# Patient Record
Sex: Female | Born: 1990 | Race: White | Hispanic: No | Marital: Married | Smoking: Former smoker
Health system: Southern US, Community
[De-identification: ages and names within clinical notes are randomized; demographics above are authoritative.]

## PROBLEM LIST (undated history)

## (undated) ENCOUNTER — Inpatient Hospital Stay (HOSPITAL_COMMUNITY): Payer: Self-pay

## (undated) DIAGNOSIS — F909 Attention-deficit hyperactivity disorder, unspecified type: Secondary | ICD-10-CM

## (undated) DIAGNOSIS — Z8489 Family history of other specified conditions: Secondary | ICD-10-CM

## (undated) DIAGNOSIS — K589 Irritable bowel syndrome without diarrhea: Secondary | ICD-10-CM

## (undated) DIAGNOSIS — S99921A Unspecified injury of right foot, initial encounter: Secondary | ICD-10-CM

## (undated) HISTORY — PX: KNEE ARTHROSCOPY: SHX127

---
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
## 2009-01-22 ENCOUNTER — Telehealth: Payer: Self-pay | Admitting: Family Medicine

## 2009-02-16 ENCOUNTER — Ambulatory Visit: Payer: Self-pay | Admitting: Family Medicine

## 2009-02-16 DIAGNOSIS — N63 Unspecified lump in unspecified breast: Secondary | ICD-10-CM | POA: Insufficient documentation

## 2009-05-04 ENCOUNTER — Ambulatory Visit: Payer: Self-pay | Admitting: Family Medicine

## 2009-05-04 ENCOUNTER — Other Ambulatory Visit: Admission: RE | Admit: 2009-05-04 | Discharge: 2009-05-04 | Payer: Self-pay | Admitting: Family Medicine

## 2009-05-04 DIAGNOSIS — N760 Acute vaginitis: Secondary | ICD-10-CM | POA: Insufficient documentation

## 2009-05-07 LAB — CONVERTED CEMR LAB: Pap Smear: NEGATIVE

## 2009-06-01 ENCOUNTER — Telehealth: Payer: Self-pay | Admitting: Family Medicine

## 2009-06-29 ENCOUNTER — Ambulatory Visit: Payer: Self-pay | Admitting: Family Medicine

## 2009-06-29 DIAGNOSIS — L255 Unspecified contact dermatitis due to plants, except food: Secondary | ICD-10-CM

## 2009-07-01 ENCOUNTER — Emergency Department (HOSPITAL_COMMUNITY): Admission: EM | Admit: 2009-07-01 | Discharge: 2009-07-01 | Payer: Self-pay | Admitting: Emergency Medicine

## 2009-07-01 ENCOUNTER — Telehealth: Payer: Self-pay | Admitting: Family Medicine

## 2009-07-03 ENCOUNTER — Telehealth: Payer: Self-pay | Admitting: Family Medicine

## 2009-07-03 ENCOUNTER — Encounter: Payer: Self-pay | Admitting: Family Medicine

## 2009-07-14 ENCOUNTER — Telehealth: Payer: Self-pay | Admitting: Family Medicine

## 2009-08-10 ENCOUNTER — Ambulatory Visit: Payer: Self-pay | Admitting: Family Medicine

## 2009-08-10 DIAGNOSIS — J029 Acute pharyngitis, unspecified: Secondary | ICD-10-CM | POA: Insufficient documentation

## 2009-08-20 ENCOUNTER — Telehealth: Payer: Self-pay | Admitting: Family Medicine

## 2009-11-02 ENCOUNTER — Other Ambulatory Visit: Admission: RE | Admit: 2009-11-02 | Discharge: 2009-11-02 | Payer: Self-pay | Admitting: Family Medicine

## 2009-11-02 ENCOUNTER — Ambulatory Visit: Payer: Self-pay | Admitting: Family Medicine

## 2009-11-02 LAB — CONVERTED CEMR LAB
AST: 21 units/L (ref 0–37)
Albumin: 4.2 g/dL (ref 3.5–5.2)
Alkaline Phosphatase: 65 units/L (ref 39–117)
Basophils Absolute: 0 10*3/uL (ref 0.0–0.1)
Bilirubin, Direct: 0.1 mg/dL (ref 0.0–0.3)
CO2: 26 meq/L (ref 19–32)
Calcium: 9.7 mg/dL (ref 8.4–10.5)
Creatinine, Ser: 0.9 mg/dL (ref 0.4–1.2)
Eosinophils Absolute: 0.1 10*3/uL (ref 0.0–0.7)
GFR calc non Af Amer: 83.26 mL/min (ref >60)
Glucose, Bld: 97 mg/dL (ref 70–99)
Hemoglobin: 13.8 g/dL (ref 12.0–15.0)
Lymphocytes Relative: 26.3 % (ref 12.0–46.0)
MCHC: 34.1 g/dL (ref 30.0–36.0)
Monocytes Relative: 5.9 % (ref 3.0–12.0)
Neutro Abs: 4.8 10*3/uL (ref 1.4–7.7)
Neutrophils Relative %: 66.5 % (ref 43.0–77.0)
Platelets: 233 10*3/uL (ref 150.0–400.0)
RDW: 12.5 % (ref 11.5–14.6)
Sodium: 144 meq/L (ref 135–145)
Total Bilirubin: 0.9 mg/dL (ref 0.3–1.2)

## 2009-11-06 ENCOUNTER — Telehealth: Payer: Self-pay | Admitting: Family Medicine

## 2009-11-10 ENCOUNTER — Ambulatory Visit: Payer: Self-pay | Admitting: Family Medicine

## 2009-11-11 DIAGNOSIS — S060XAA Concussion with loss of consciousness status unknown, initial encounter: Secondary | ICD-10-CM | POA: Insufficient documentation

## 2009-11-11 DIAGNOSIS — S060X9A Concussion with loss of consciousness of unspecified duration, initial encounter: Secondary | ICD-10-CM

## 2009-12-31 ENCOUNTER — Ambulatory Visit: Payer: Self-pay | Admitting: Family Medicine

## 2010-01-18 IMAGING — CT CT ABDOMEN W/O CM
2 of 4 series · 17 of 46 positions shown, 19 images · non-contrast
Comparison: None.

CT ABDOMEN

CLINICAL DATA: Low back pain and hematuria for 3 days.  Bladder
infection.  Question ureteral calculus.

CT ABDOMEN AND PELVIS WITHOUT CONTRAST
TECHNIQUE: Multidetector CT imaging of the abdomen and pelvis was
performed following the standard
protocol without intravenous contrast.

[Series 2: abd_pel_w/o 5.0 b30f st · axial · 0.68mm/px · z∈[-427,-57]mm · 14 of 82 slices shown, 16 images]
[im 4/82  soft-tissue]
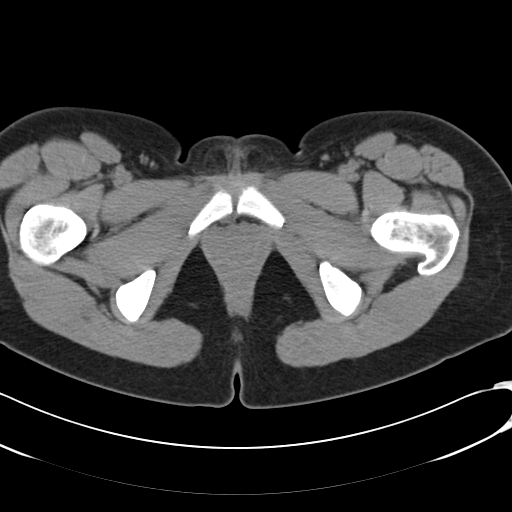
[im 4/82  bone]
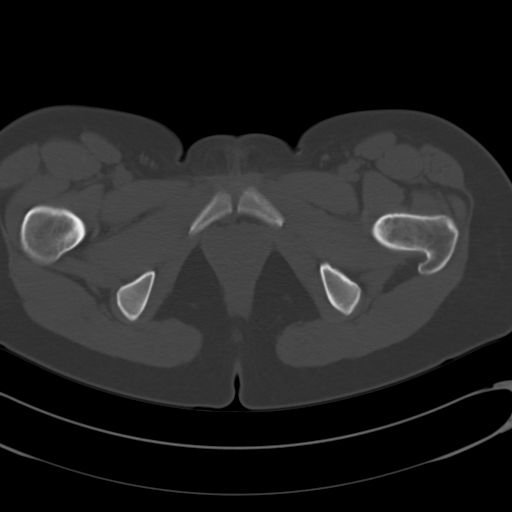
[im 10/82  soft-tissue]
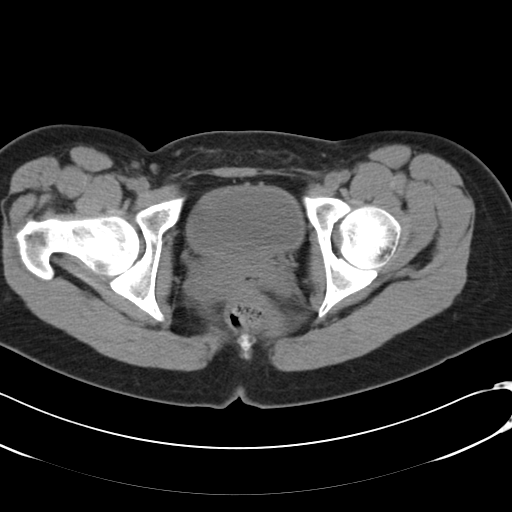
[im 16/82  soft-tissue]
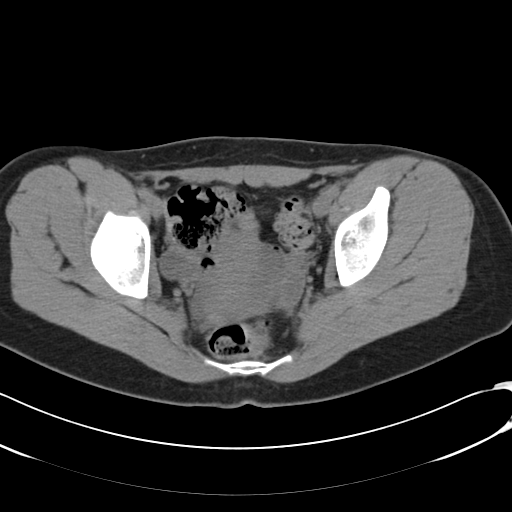
[im 22/82  soft-tissue]
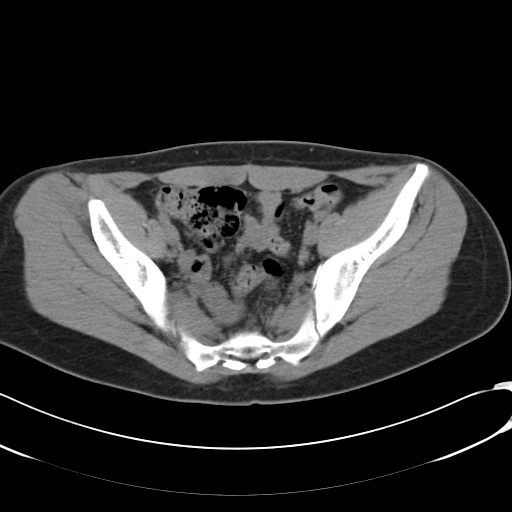
[im 29/82  soft-tissue]
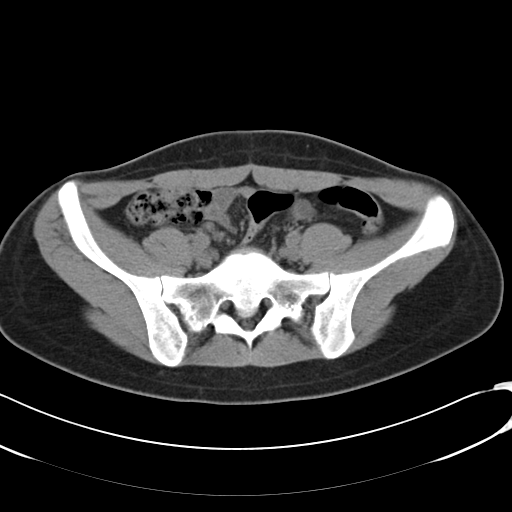
[im 32/82  soft-tissue]
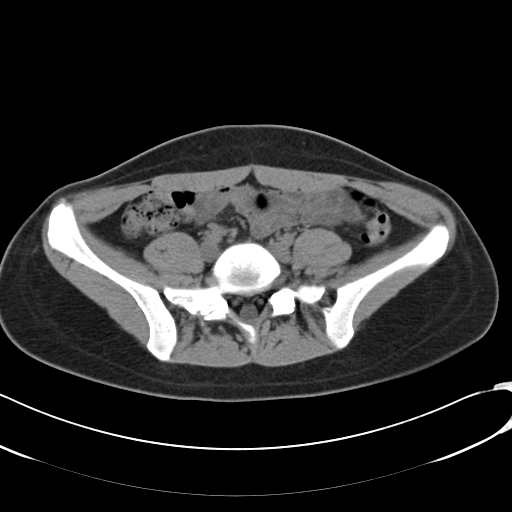
[im 38/82  soft-tissue]
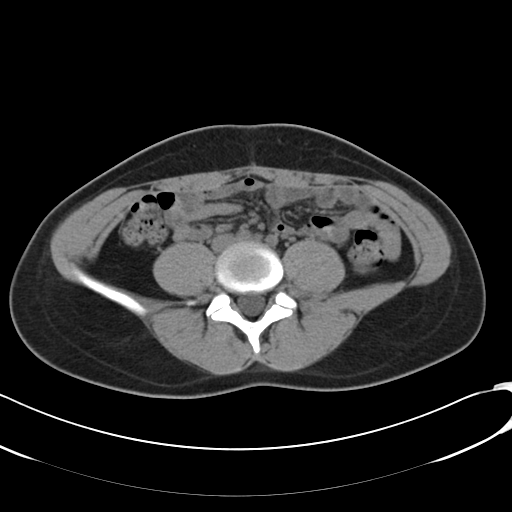
[im 44/82  soft-tissue]
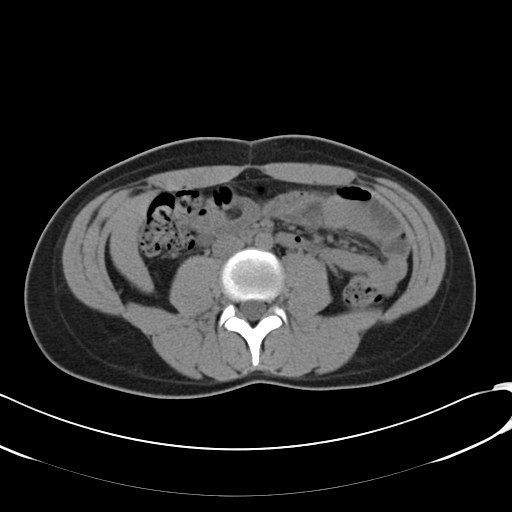
[im 50/82  soft-tissue]
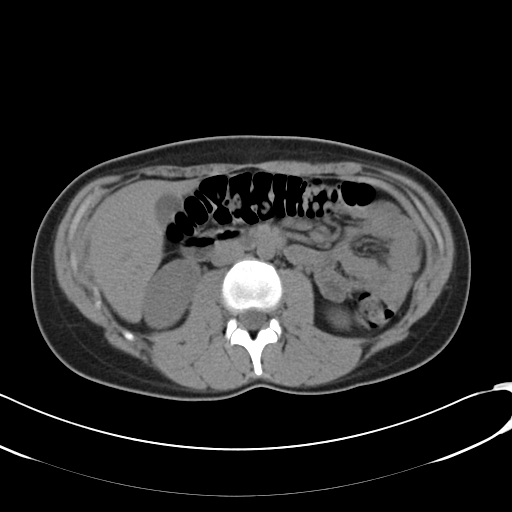
[im 50/82  bone]
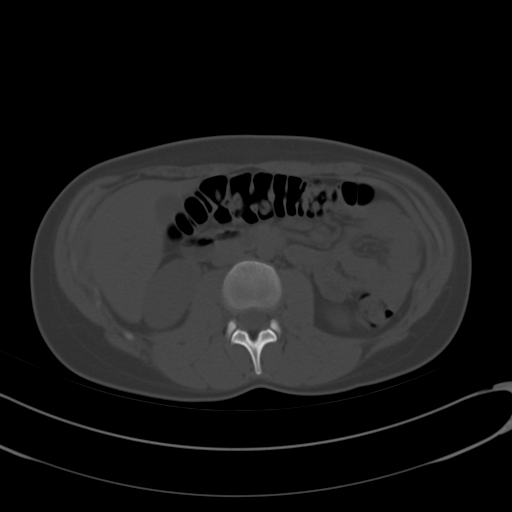
[im 53/82  soft-tissue]
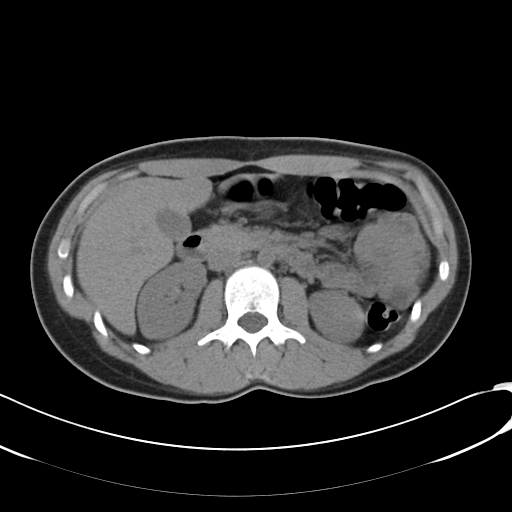
[im 60/82  soft-tissue]
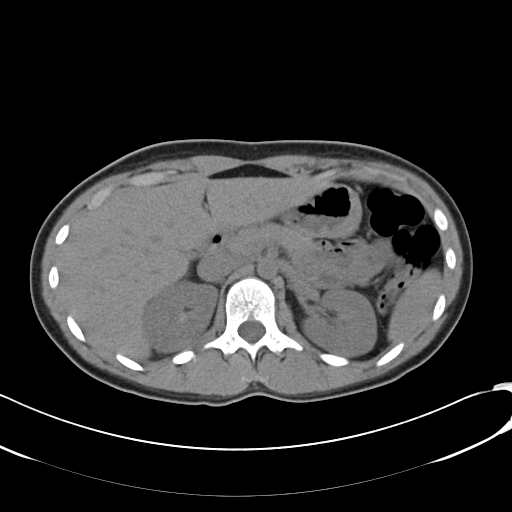
[im 66/82  soft-tissue]
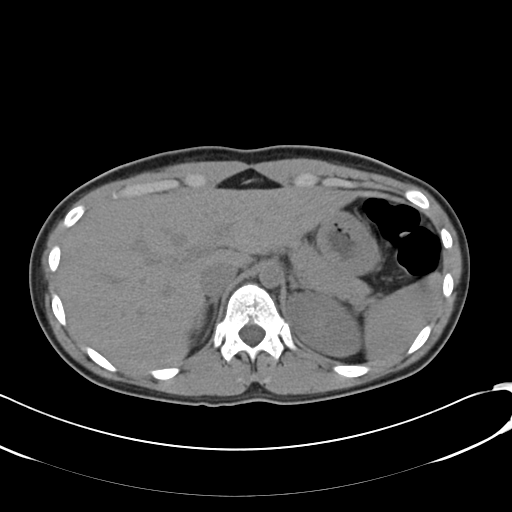
[im 72/82  soft-tissue]
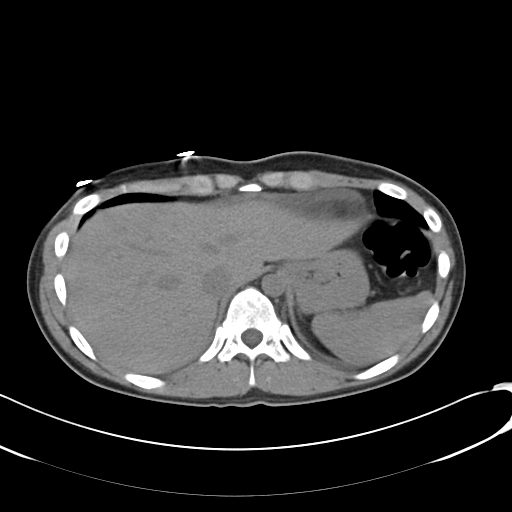
[im 78/82  soft-tissue]
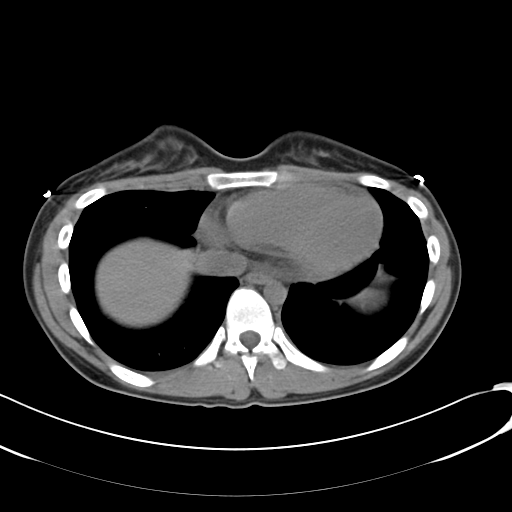

[Series 602: <mpr range> · coronal · 0.81mm/px · 3 of 84 slices shown]
[im 28/84  soft-tissue]
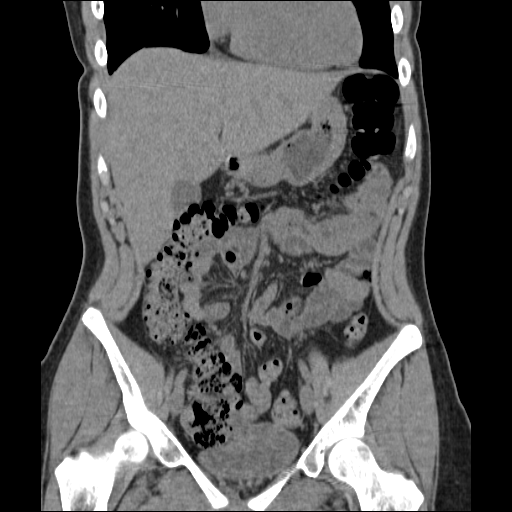
[im 37/84  soft-tissue]
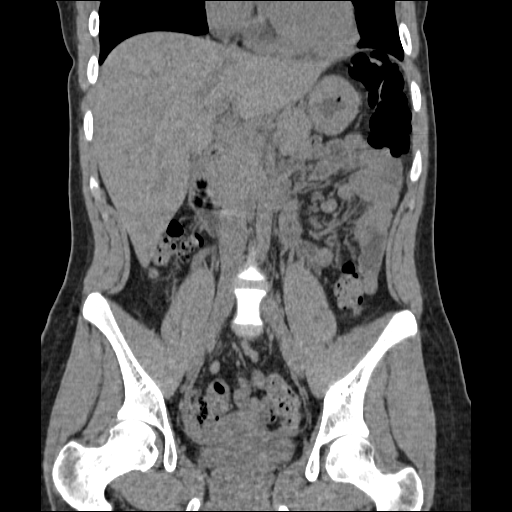
[im 47/84  soft-tissue]
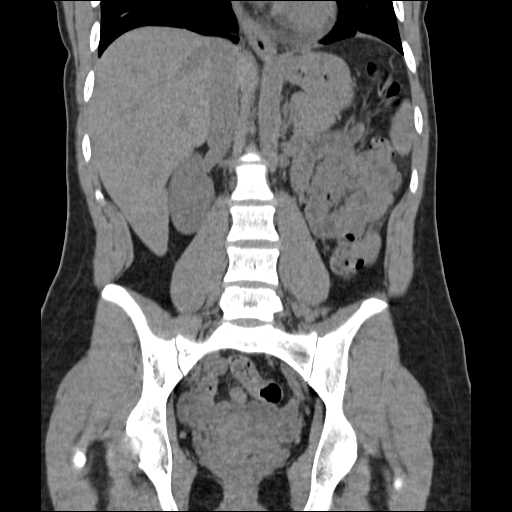

[17 of 46 positions shown; findings below may reference images not displayed]

FINDINGS: No renal or ureteral calculi are demonstrated.  There is
no hydronephrosis or perinephric soft tissue stranding.  Both
kidneys appear unremarkable as imaged in the unenhanced state.

The lung bases are clear.  There is no pleural effusion.  The
liver, spleen, gallbladder, pancreas and adrenal glands appear
unremarkable.  The lumbar spine demonstrates no abnormality.
IMPRESSION: 1.  Negative for urinary tract calculus or hydronephrosis.
2.  No nephric or perinephric inflammatory changes are identified.

CT PELVIS
FINDINGS: Distally, the ureters are normal in caliber.  There is
no evidence of ureteral or bladder calculus.  No pelvic
inflammatory process, fluid collection or mass is demonstrated.
IMPRESSION: 1.  Negative for urinary tract calculus or hydronephrosis.
2.  No pelvic inflammatory changes are identified.

## 2010-02-16 NOTE — Assessment & Plan Note (Signed)
Summary: st/njr Samantha Medina will give depo shot/njr   Vital Signs:  Patient profile:   20 year old female Menstrual status:  regular Height:      63 inches (160.02 cm) Weight:      128.31 pounds (58.32 kg) O2 Sat:      98 % on Room air Temp:     98.6 degrees F (37.00 degrees C) oral Pulse rate:   63 / minute BP sitting:   120 / 68  (left arm) Cuff size:   regular  Vitals Entered By: Josph Macho RMA (August 10, 2009 4:06 PM)  O2 Flow:  Room air CC: Sore throat X1 day- pt states she gets Strep Throat frequently/ Depo shot/ CF Is Patient Diabetic? No   History of Present Illness: Patient in today for her Depo shot and has just started to have a sore throat over the past day. She has a history of recurrent strep throat in the past. She has a severe throat  that is more painful when she eats or swallows. She denies any nasal congesiton/f/c/HA yet. No nasal congestion/cough/CP/SOB/GI c/o.   Current Medications (verified): 1)  Adderall Xr 30 Mg Xr24h-Cap (Amphetamine-Dextroamphetamine) .... Take 1 Tablet By Mouth Every Morning 2)  Adderall Xr 30 Mg Xr24h-Cap (Amphetamine-Dextroamphetamine) .... Take One Tab in The Evening  Fill in One Month 3)  Adderall Xr 30 Mg Xr24h-Cap (Amphetamine-Dextroamphetamine) .... Take One Tab in The Evening   Fill in Two Months  Allergies (verified): No Known Drug Allergies  Past History:  Past medical history reviewed for relevance to current acute and chronic problems. Social history (including risk factors) reviewed for relevance to current acute and chronic problems.  Past Medical History: Reviewed history from 07/09/2007 and no changes required. Depression HPV abn pap  Social History: Reviewed history from 01/22/2008 and no changes required. Negative history of passive tobacco smoke exposure.  Single Drug use-no Occupation: Alcohol use-no  Review of Systems      See HPI  Physical Exam  General:  Well-developed,well-nourished,in no acute  distress; alert,appropriate and cooperative throughout examination Head:  Normocephalic and atraumatic without obvious abnormalities Eyes:  No corneal or conjunctival inflammation noted. EOMI. Marland Kitchen Mouth:  Oral mucosa without lesions. Pus pockets noted on b/l tonsils. Both tonsils erythematous and 1/4. Neck:  No deformities, masses, or tenderness noted. Lungs:  Normal respiratory effort, chest expands symmetrically. Lungs are clear to auscultation, no crackles or wheezes. Heart:  Normal rate and regular rhythm. S1 and S2 normal without gallop, murmur, click, rub or other extra sounds. Abdomen:  Bowel sounds positive,abdomen soft and non-tender without masses, organomegaly or hernias noted. Extremities:  No clubbing, cyanosis, edema, or deformity noted with normal full range of motion of all joints.   Cervical Nodes:  R anterior LN enlarged and L anterior LN enlarged.   Psych:  Cognition and judgment appear intact. Alert and cooperative with normal attention span and concentration. No apparent delusions, illusions, hallucinations   Impression & Recommendations:  Problem # 1:  PHARYNGITIS, ACUTE (ICD-462)  The following medications were removed from the medication list:    Metronidazole 250 Mg Tabs (Metronidazole) .Marland Kitchen... Take one tab by mouth three times a day Her updated medication list for this problem includes:    Zithromax 250 Mg Tabs (Azithromycin) .Marland Kitchen... 2 tabs by mouth once and then 1 tab by mouth once daily x 4 days Push clear fluids, Ibuprofen as needed, report if symptoms do not resolve Encouraged Align probiotic daily for next month to decrease  likelihood of recurrent BV or yeast  Problem # 2:  CONTRACEPTIVE MANAGEMENT (ICD-V25.09)  Orders: Depo-Provera 150mg  (J1055) Admin of Therapeutic Inj  intramuscular or subcutaneous ([address]) tolerated shot today  Complete Medication List: 1)  Adderall Xr 30 Mg Xr24h-cap (Amphetamine-dextroamphetamine) .... Take 1 tablet by mouth every  morning 2)  Adderall Xr 30 Mg Xr24h-cap (Amphetamine-dextroamphetamine) .... Take one tab in the evening  fill in one month 3)  Adderall Xr 30 Mg Xr24h-cap (Amphetamine-dextroamphetamine) .... Take one tab in the evening   fill in two months 4)  Zithromax 250 Mg Tabs (Azithromycin) .... 2 tabs by mouth once and then 1 tab by mouth once daily x 4 days  Patient Instructions: 1)  Please schedule a follow-up appointment as needed if symptoms worsen 2)  Take your antibiotic as prescribed until ALL of it is gone, but stop if you develop a rash or swelling and contact our office as soon as possible.  3)  Take Align probiotic caps daily x 1 month whenever you take an antibiotic Prescriptions: ZITHROMAX 250 MG TABS (AZITHROMYCIN) 2 tabs by mouth once and then 1 tab by mouth once daily x 4 days  #6 x 0   Entered and Authorized by:   Danise Edge MD   Signed by:   Danise Edge MD on 08/10/2009   Method used:   Electronically to        [facility] [address] [phone]* (retail)       [address]       [place], [place]  [address]       Ph: [phone] or [phone]       Fax: [phone]   RxID:   [id number]    Medication Administration  Injection # 1:    Medication: Depo-Provera 150mg     Diagnosis: CONTRACEPTIVE MANAGEMENT (ICD-V25.09)    Route: IM    Site: RUOQ gluteus    Exp Date: 03/17/2012    Lot #: [id number]    Mfr: [email or web address]    Comments: patient to return October 2011    Patient tolerated injection without complications    Given by: Kern Reap CMA Duncan Dull) (August 10, 2009 4:57 PM)  Orders Added: 1)  Depo-Provera 150mg  [J1055] 2)  Admin of Therapeutic Inj  intramuscular or subcutaneous [96372] 3)  Est. Patient Level III [95638]

## 2010-02-16 NOTE — Assessment & Plan Note (Signed)
Summary: recent fall/dm   Vital Signs:  Patient profile:   20 year old female Menstrual status:  irregular Weight:      124 pounds Temp:     98.4 degrees F oral BP sitting:   120 / 84  (left arm) Cuff size:   regular  Vitals Entered By: Kern Reap CMA Duncan Dull) (November 10, 2009 4:13 PM) CC: fell and hit head   CC:  fell and hit head.  History of Present Illness: Samantha Medina is a 20 year old single female, who comes in today for evaluation of a fall.  She states about two weeks ago she tripped and fell in her bedroom at home and hit the back of her head.  She was not knocked out.  She is initially a small hematoma that resolved.  Neurologic review of systems negative  Allergies: No Known Drug Allergies  Review of Systems      See HPI  Physical Exam  General:  Well-developed,well-nourished,in no acute distress; alert,appropriate and cooperative throughout examination Neurologic:  No cranial nerve deficits noted. Station and gait are normal. Plantar reflexes are down-going bilaterally. DTRs are symmetrical throughout. Sensory, motor and coordinative functions appear intact.   Impression & Recommendations:  Problem # 1:  CONCUSSION (ICD-850.9) Assessment New  Complete Medication List: 1)  Adderall Xr 30 Mg Xr24h-cap (Amphetamine-dextroamphetamine) .... Take 1 tablet by mouth every morning 2)  Adderall Xr 30 Mg Xr24h-cap (Amphetamine-dextroamphetamine) .... Take one tab in the evening  fill in one month 3)  Adderall Xr 30 Mg Xr24h-cap (Amphetamine-dextroamphetamine) .... Take one tab in the evening   fill in two months 4)  Zithromax 250 Mg Tabs (Azithromycin) .... 2 tabs by mouth once and then 1 tab by mouth once daily x 4 days 5)  Nuvaring 0.12-0.015 Mg/24hr Ring (Etonogestrel-ethinyl estradiol) .... Uad 6)  Metronidazole 500 Mg Tabs (Metronidazole) .Marland Kitchen.. 1 tab @ bedtime  Patient Instructions: 1)  be reassured that over the next two to  4 weeks.  The headache and etc., will  resolve.  Return p.r.n.   Orders Added: 1)  Est. Patient Level IV [29562]

## 2010-02-16 NOTE — Assessment & Plan Note (Signed)
Summary: stds/poison ivy/dm   Vital Signs:  Patient profile:   20 year old female Menstrual status:  regular Weight:      127 pounds Temp:     97.9 degrees F oral BP sitting:   110 / 70  (left arm) Cuff size:   regular  Vitals Entered By: Kern Reap CMA Duncan Dull) (June 29, 2009 9:26 AM) CC: poison ivy, STD    CC:  poison ivy and STD .  History of Present Illness: Samantha Medina is a 20 year old single female, who comes in today to discuss 3 problems.  She would like to be on some type of birth control.  We discussed the various options.  She elects to try the Depo-Provera.  Will give her her first shot when her next period starts.  She is also concerned she may have an STD although she is asymptomatic.  She continues to take her at all and is working well.  Another new problem as a contact dermatitis.  Now she's broken out all over.  Allergies (verified): No Known Drug Allergies  Past History:  Past medical, surgical, family and social histories (including risk factors) reviewed for relevance to current acute and chronic problems.  Past Medical History: Reviewed history from 07/09/2007 and no changes required. Depression HPV abn pap  Family History: Reviewed history from 09/14/2006 and no changes required. Family History of Hypertension Fam hx Asthma  Social History: Reviewed history from 01/22/2008 and no changes required. Negative history of passive tobacco smoke exposure.  Single Drug use-no Occupation: Alcohol use-no  Review of Systems      See HPI  Physical Exam  General:  Well-developed,well-nourished,in no acute distress; alert,appropriate and cooperative throughout examination Abdomen:  Bowel sounds positive,abdomen soft and non-tender without masses, organomegaly or hernias noted. Genitalia:  Pelvic Exam:        External: normal female genitalia without lesions or masses        Vagina: normal without lesions or masses        Cervix: normal without  lesions or masses        Adnexa: normal bimanual exam without masses or fullness        Uterus: normal by palpation        Pap smear: not performed Skin:  Intact without suspicious lesions or rashes   Impression & Recommendations:  Problem # 1:  CONTACT DERMATITIS&OTHER ECZEMA DUE TO PLANTS (ICD-692.6) Assessment New  Her updated medication list for this problem includes:    Prednisone 20 Mg Tabs (Prednisone) ..... Uad  Problem # 2:  BACTERIAL VAGINITIS (ICD-616.10) Assessment: Deteriorated  The following medications were removed from the medication list:    Flagyl 250 Mg Tabs (Metronidazole) .Marland Kitchen... Take 1 tablet by mouth two times a day  Complete Medication List: 1)  Adderall Xr 30 Mg Xr24h-cap (Amphetamine-dextroamphetamine) .... Take 1 tablet by mouth every morning 2)  Adderall Xr 30 Mg Xr24h-cap (Amphetamine-dextroamphetamine) .... Take one tab in the evening  fill in one month 3)  Adderall Xr 30 Mg Xr24h-cap (Amphetamine-dextroamphetamine) .... Take one tab in the evening   fill in two months 4)  Methylphenidate Hcl 10 Mg Tabs (Methylphenidate hcl) .Marland Kitchen.. 1 @ 2pm 5)  Prednisone 20 Mg Tabs (Prednisone) .... Uad  Other Orders: T-Chlamydia Probe, genital [phone]) T-GC Probe, genital [phone])  Patient Instructions: 1)  begin prednisone, take two tabs x 3 days, one x 3 days, a half x 3 days, then half a tablet Monday, Wednesday, Friday, for a two week taper.  2)  Return in October for a follow-up Pap smear. 3)  Return in June, the 27th Monday to see Fleet Contras to get y  first step of shot. Prescriptions: PREDNISONE 20 MG TABS (PREDNISONE) UAD  #30 x 1   Entered and Authorized by:   Roderick Pee MD   Signed by:   Roderick Pee MD on 06/29/2009   Method used:   Electronically to        [facility] [address] #[id number]* (retail)       [address] [place], [place]  [address]       Ph: [phone]       Fax: [phone]   RxID:   [id number]

## 2010-02-16 NOTE — Progress Notes (Signed)
Summary: needs note today  Phone Note Call from Patient Call back at Home Phone [phone]   Caller: Patient--live call Summary of Call: was here this week with a rash. went to hosp and they told her that it was hives. she is requesting a dictated note stating that her rash was not contagious to send to her job. please call her when ready for pick up today. Initial call taken by: Warnell Forester,  July 03, 2009 1:54 PM  Follow-up for Phone Call        ok Follow-up by: Roderick Pee MD,  July 03, 2009 1:58 PM  Additional Follow-up for Phone Call Additional follow up Details #1::        left message on machine for patient  Additional Follow-up by: Kern Reap CMA Duncan Dull),  July 03, 2009 2:05 PM

## 2010-02-16 NOTE — Progress Notes (Signed)
Summary: new rx  Phone Note Call from Patient Call back at Home Phone [phone]   Caller: Patient Call For: Samantha Pee MD Summary of Call: pt needs adderall xr 30 mg pt has one pill left. Initial call taken by: Heron Sabins,  January 22, 2009 12:27 PM  Follow-up for Phone Call        rx ready for pick up and cpx scheduled Follow-up by: Kern Reap CMA Duncan Dull),  January 22, 2009 2:29 PM    Prescriptions: ADDERALL XR 30 MG XR24H-CAP (AMPHETAMINE-DEXTROAMPHETAMINE) take one tab in the evening   fill in two months  #30 x 0   Entered by:   Kern Reap CMA (AAMA)   Authorized by:   Samantha Pee MD   Signed by:   Kern Reap CMA (AAMA) on 01/22/2009   Method used:   Print then Give to Patient   RxID:   [id number] ADDERALL XR 30 MG XR24H-CAP (AMPHETAMINE-DEXTROAMPHETAMINE) take one tab in the evening  fill in one month  #30 x 0   Entered by:   Kern Reap CMA (AAMA)   Authorized by:   Samantha Pee MD   Signed by:   Kern Reap CMA (AAMA) on 01/22/2009   Method used:   Print then Give to Patient   RxID:   [id number] ADDERALL XR 30 MG XR24H-CAP (AMPHETAMINE-DEXTROAMPHETAMINE) Take 1 tablet by mouth every morning  #30 x 0   Entered by:   Kern Reap CMA (AAMA)   Authorized by:   Samantha Pee MD   Signed by:   Kern Reap CMA (AAMA) on 01/22/2009   Method used:   Print then Give to Patient   RxID:   [id number]

## 2010-02-16 NOTE — Progress Notes (Signed)
Summary: D/C Depo Provera  Phone Note Call from Patient   Caller: Patient Call For: Roderick Pee MD Summary of Call: Pt wants to stop Depo due to increase appetite, and rash on chest. [phone]  Follow-up for Phone Call        ok.............Marland Kitchenwhat alternative type of birth control, would  she like  to use, or which she liked to come in so we can discuss it?????????????? Follow-up by: Roderick Pee MD,  August 20, 2009 9:40 AM  Additional Follow-up for Phone Call Additional follow up Details #1::        patient does not want the pill.  she is would like to continue the depo if the rash will go away. Additional Follow-up by: Kern Reap CMA Duncan Dull),  August 20, 2009 5:28 PM    Additional Follow-up for Phone Call Additional follow up Details #2::    make an appointment on Monday to let me look at the rash Follow-up by: Roderick Pee MD,  August 21, 2009 1:38 PM  Additional Follow-up for Phone Call Additional follow up Details #3:: Details for Additional Follow-up Action Taken: it's not a rash it is acne, weight gain and she is wondering if this is a side affect. Additional Follow-up by: Kern Reap CMA Duncan Dull),  August 21, 2009 2:54 PM

## 2010-02-16 NOTE — Assessment & Plan Note (Signed)
Summary: cpx/ pt will come fasting/mm   Vital Signs:  Patient profile:   20 year old female Menstrual status:  regular LMP:     02/16/2008 Height:      63 inches Weight:      126 pounds BMI:     22.40 Temp:     98.1 degrees F oral BP sitting:   120 / 80  (left arm) Cuff size:   regular  Vitals Entered By: Kern Reap CMA Duncan Dull) (February 16, 2009 11:26 AM)  Reason for Visit cpx  History of Present Illness: Samantha Medina is a 20 year old single female in gauge to to be married in the fall of 2011 and comes in today for physical examination,  She's always been in excellent, health.  She's had no chronic health problems.  She has underlying ADD for which he takes Adderall 30 mg long-acting daily.  She is doing well at [facility] focus and concentration are excellent.  She feels much better taking the medication.  LMP now.  Birth control.  She stopped her BCPs.  She is not concerned if she gets pregnant and engaged to be married.  Tetanus booster and flu shot today.    Allergies: No Known Drug Allergies  Past History:  Past medical, surgical, family and social histories (including risk factors) reviewed, and no changes noted (except as noted below).  Past Medical History: Reviewed history from 07/09/2007 and no changes required. Depression HPV abn pap  Family History: Reviewed history from 09/14/2006 and no changes required. Family History of Hypertension Fam hx Asthma  Social History: Reviewed history from 01/22/2008 and no changes required. Negative history of passive tobacco smoke exposure.  Single Drug use-no Occupation: Alcohol use-no  Review of Systems      See HPI       Flu Vaccine Consent Questions     Do you have a history of severe allergic reactions to this vaccine? no    Any prior history of allergic reactions to egg and/or gelatin? no    Do you have a sensitivity to the preservative Thimersol? no    Do you have a past history of Guillan-Barre Syndrome?  no    Do you currently have an acute febrile illness? no    Have you ever had a severe reaction to latex? no    Vaccine information given and explained to patient? yes    Are you currently pregnant? no    Lot Number:[id number]   Exp Date:07/16/2009   Site Given  right  Deltoid IM   Physical Exam  General:  Well-developed,well-nourished,in no acute distress; alert,appropriate and cooperative throughout examination Head:  Normocephalic and atraumatic without obvious abnormalities. No apparent alopecia or balding. Eyes:  No corneal or conjunctival inflammation noted. EOMI. Perrla. Funduscopic exam benign, without hemorrhages, exudates or papilledema. Vision grossly normal. Ears:  External ear exam shows no significant lesions or deformities.  Otoscopic examination reveals clear canals, tympanic membranes are intact bilaterally without bulging, retraction, inflammation or discharge. Hearing is grossly normal bilaterally. Nose:  External nasal examination shows no deformity or inflammation. Nasal mucosa are pink and moist without lesions or exudates. Mouth:  Oral mucosa and oropharynx without lesions or exudates.  Teeth in good repair. Neck:  No deformities, masses, or tenderness noted. Chest Wall:  No deformities, masses, or tenderness noted. Breasts:  the right breast is normal.  The left breast at the 4 o'clock position 1 inch from the nipple is a marble like a cystic lesion.  Its soft  is rubbery and movable.this is a new finding.  The patient was taught how to do breast self examination Lungs:  Normal respiratory effort, chest expands symmetrically. Lungs are clear to auscultation, no crackles or wheezes. Heart:  Normal rate and regular rhythm. S1 and S2 normal without gallop, murmur, click, rub or other extra sounds. Abdomen:  Bowel sounds positive,abdomen soft and non-tender without masses, organomegaly or hernias noted. Msk:  No deformity or scoliosis noted of thoracic or lumbar spine.    Pulses:  R and L carotid,radial,femoral,dorsalis pedis and posterior tibial pulses are full and equal bilaterally Extremities:  No clubbing, cyanosis, edema, or deformity noted with normal full range of motion of all joints.   Neurologic:  No cranial nerve deficits noted. Station and gait are normal. Plantar reflexes are down-going bilaterally. DTRs are symmetrical throughout. Sensory, motor and coordinative functions appear intact. Skin:  Intact without suspicious lesions or rashes Cervical Nodes:  No lymphadenopathy noted Axillary Nodes:  No palpable lymphadenopathy Inguinal Nodes:  No significant adenopathy Psych:  Cognition and judgment appear intact. Alert and cooperative with normal attention span and concentration. 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 Take 1 tablet by mouth every morning 2)  Adderall Xr 30 Mg Xr24h-cap (Amphetamine-dextroamphetamine) .... Take one tab in the evening  fill in one month 3)  Adderall Xr 30 Mg Xr24h-cap (Amphetamine-dextroamphetamine) .... Take one tab in the evening   fill in two months  Other Orders: Admin 1st Vaccine (95621) Flu Vaccine 75yrs + (30865) Tdap => 85yrs IM (78469) Admin of Any Addtl Vaccine (62952)  Patient Instructions: 1)  continue your current medications.  Return in two weeks for your Pap smear. 2)  Do a thorough breast exam monthly.  If the cystic lesion in your left breast increases in size.  Let us recheck that   Immunizations Administered:  Tetanus Vaccine:    Vaccine Type: Tdap    Site: left deltoid    Mfr: GlaxoSmithKline    Dose: 0.5 ml    Route:  IM    Given by: Kern Reap CMA (AAMA)    Exp. Date: 11/12/2009    Lot #: [id number]    Physician counseled: yes

## 2010-02-16 NOTE — Progress Notes (Signed)
  Phone Note Outgoing Call   Summary of Call: her Pap smear in April was clear, however, this shows some slight changes, which is otherwise, okay.  All STD test negative.  Currently taking Flagyl, 500 b.i.d. for BP follow-up as outlined Initial call taken by: Roderick Pee MD,  November 06, 2009 12:47 PM

## 2010-02-16 NOTE — Progress Notes (Signed)
Summary: short breath  Phone Note Call from Patient   Summary of Call: Short of breath this am.  Now getting dizzy.  Mentions that she was told that shot for poison ivy given if short of breath when she saw Dr. Tawanna Cooler earlier this week.  To ER now via 911 if necessary. Initial call taken by: Rudy Jew, RN,  July 01, 2009 1:09 PM

## 2010-02-16 NOTE — Assessment & Plan Note (Signed)
Summary: ? YEAST INFECTION//CCM   Vital Signs:  Patient profile:   20 year old female Menstrual status:  regular Weight:      133 pounds Temp:     98.0 degrees F BP sitting:   120 / 80  (left arm) Cuff size:   regular  Vitals Entered By: Kern Reap CMA Duncan Dull) (May 04, 2009 3:36 PM) CC: vaginal discharge   CC:  vaginal discharge.  History of Present Illness: Samantha Medina is a 20 year old single female, nonsmoker, who comes in today for general physical examination  She still is in a neck, house she's had no chronic health problems except for ADD.  She is currently on Adderall 30 mg daily, however, she is having breakthrough symptoms in the afternoon.  We discussed various strategies.  Will give her a short acting tablet to take p.r.n..  Her period ended April, the eighth.  For birth control she and her boyfriend use condoms.  Four days ago she began having a malodorous vaginal discharge.  No itching.  She also has a history of fibrocystic breasts.  Lesion 9 o'clock left breast at the nipple area, unchanged.  She does BSE monthly  Allergies: No Known Drug Allergies  Past History:  Past medical, surgical, family and social histories (including risk factors) reviewed, and no changes noted (except as noted below).  Past Medical History: Reviewed history from 07/09/2007 and no changes required. Depression HPV abn pap  Family History: Reviewed history from 09/14/2006 and no changes required. Family History of Hypertension Fam hx Asthma  Social History: Reviewed history from 01/22/2008 and no changes required. Negative history of passive tobacco smoke exposure.  Single Drug use-no Occupation: Alcohol use-no  Review of Systems      See HPI  Physical Exam  General:  Well-developed,well-nourished,in no acute distress; alert,appropriate and cooperative throughout examination Head:  Normocephalic and atraumatic without obvious abnormalities. No apparent alopecia or  balding. Eyes:  No corneal or conjunctival inflammation noted. EOMI. Perrla. Funduscopic exam benign, without hemorrhages, exudates or papilledema. Vision grossly normal. Ears:  External ear exam shows no significant lesions or deformities.  Otoscopic examination reveals clear canals, tympanic membranes are intact bilaterally without bulging, retraction, inflammation or discharge. Hearing is grossly normal bilaterally. Nose:  External nasal examination shows no deformity or inflammation. Nasal mucosa are pink and moist without lesions or exudates. Mouth:  Oral mucosa and oropharynx without lesions or exudates.  Teeth in good repair. Neck:  No deformities, masses, or tenderness noted. Chest Wall:  No deformities, masses, or tenderness noted. Breasts:  right breast normal left breast.  A soft pea-sized lesion at the 9 o'clock position next to the nipple.  Its been present previously.  No change Lungs:  Normal respiratory effort, chest expands symmetrically. Lungs are clear to auscultation, no crackles or wheezes. Heart:  Normal rate and regular rhythm. S1 and S2 normal without gallop, murmur, click, rub or other extra sounds. Abdomen:  Bowel sounds positive,abdomen soft and non-tender without masses, organomegaly or hernias noted. Genitalia:  Pelvic Exam:        External: normal female genitalia without lesions or masses        Vagina: normal without lesions or masses        Cervix: normal without lesions or masses        Adnexa: normal bimanual exam without masses or fullness        Uterus: normal by palpation        Pap smear: performed Msk:  No deformity  or scoliosis noted of thoracic or lumbar spine.   Pulses:  R and L carotid,radial,femoral,dorsalis pedis and posterior tibial pulses are full and equal bilaterally Extremities:  No clubbing, cyanosis, edema, or deformity noted with normal full range of motion of all joints.   Neurologic:  No cranial nerve deficits noted. Station and gait are  normal. Plantar reflexes are down-going bilaterally. DTRs are symmetrical throughout. Sensory, motor and coordinative functions appear intact. Skin:  total body skin exam normal Cervical Nodes:  No lymphadenopathy noted Axillary Nodes:  No palpable lymphadenopathy Inguinal Nodes:  No significant adenopathy Psych:  Cognition and judgment appear intact. Alert and cooperative with normal attention span and concentration. No apparent delusions, illusions, hallucinations   Impression & Recommendations:  Problem # 1:  BREAST MASS, LEFT (ICD-611.72) Assessment Unchanged  Problem # 2:  ATTENTION DEFICIT HYPERACTIVITY DISORDER, ADULT (ICD-314.01) Assessment: Improved  Problem # 3:  PHYSICAL EXAMINATION (ICD-V70.0) Assessment: Unchanged  Problem # 4:  BACTERIAL VAGINITIS (ICD-616.10) Assessment: New  Her updated medication list for this problem includes:    Flagyl 250 Mg Tabs (Metronidazole) .Marland Kitchen... Take 1 tablet by mouth two times a day  Complete Medication List: 1)  Adderall Xr 30 Mg Xr24h-cap (Amphetamine-dextroamphetamine) .... Take 1 tablet by mouth every morning 2)  Adderall Xr 30 Mg Xr24h-cap (Amphetamine-dextroamphetamine) .... Take one tab in the evening  fill in one month 3)  Adderall Xr 30 Mg Xr24h-cap (Amphetamine-dextroamphetamine) .... Take one tab in the evening   fill in two months 4)  Methylphenidate Hcl 10 Mg Tabs (Methylphenidate hcl) .Marland Kitchen.. 1 @ 2pm 5)  Flagyl 250 Mg Tabs (Metronidazole) .... Take 1 tablet by mouth two times a day  Patient Instructions: 1)  we will call you only get your culture report back.  Take it.  Flagyl, 500 mg twice a day for one week. 2)  Remember to do BSE monthly  3)  At 10 mg of plain Ritalin in the afternoon if he needed to help with focusing concentration.  If this doesn't work, let us know. 4)  Please schedule a follow-up appointment in 1 year. Prescriptions: FLAGYL 250 MG TABS (METRONIDAZOLE) Take 1 tablet by mouth two times a day  #14 x 1    Entered and Authorized by:   Roderick Pee MD   Signed by:   Roderick Pee MD on 05/04/2009   Method used:   Electronically to        [facility] [address] [phone]* (retail)       [address]       [place], [place]  [address]       Ph: [phone] or [phone]       Fax: [phone]   RxID:   [id number] METHYLPHENIDATE HCL 10 MG TABS (METHYLPHENIDATE HCL) 1 @ 2pm  #30 x 0   Entered and Authorized by:   Roderick Pee MD   Signed by:   Roderick Pee MD on 05/04/2009   Method used:   Print then Give to Patient   RxID:   [id number] ADDERALL XR 30 MG XR24H-CAP (AMPHETAMINE-DEXTROAMPHETAMINE) take one tab in the evening   fill in two months  #30 x 0   Entered by:   Kern Reap CMA (AAMA)   Authorized by:   Roderick Pee MD   Signed by:   Kern Reap CMA (AAMA) on 05/04/2009   Method used:   Print then Give to Patient  RxID:   [id number] ADDERALL XR 30 MG XR24H-CAP (AMPHETAMINE-DEXTROAMPHETAMINE) take one tab in the evening  fill in one month  #30 x 0   Entered by:   Kern Reap CMA (AAMA)   Authorized by:   Roderick Pee MD   Signed by:   Kern Reap CMA (AAMA) on 05/04/2009   Method used:   Print then Give to Patient   RxID:   [id number] ADDERALL XR 30 MG XR24H-CAP (AMPHETAMINE-DEXTROAMPHETAMINE) Take 1 tablet by mouth every morning  #30 x 0   Entered by:   Kern Reap CMA (AAMA)   Authorized by:   Roderick Pee MD   Signed by:   Kern Reap CMA (AAMA) on 05/04/2009   Method used:   Print then Give to Patient   RxID:   [id number]

## 2010-02-16 NOTE — Progress Notes (Signed)
Summary: vaginal infection  Phone Note Call from Patient   Caller: Patient Call For: Roderick Pee MD Summary of Call: Pt has vaginal discharge (white) with no pain or itching, but foul odor.  [facility] [place] [facility] [place]) [phone] Pt was better after taking the Flagyl, but  symptoms returned. Initial call taken by: Lynann Beaver CMA,  Jun 01, 2009 9:41 AM  Follow-up for Phone Call        second round of Flagyl 250 mg b.i.d. x 10 days, refills x 1 Follow-up by: Roderick Pee MD,  Jun 01, 2009 10:22 AM    Prescriptions: FLAGYL 250 MG TABS (METRONIDAZOLE) Take 1 tablet by mouth two times a day  #20 x 1   Entered by:   Lynann Beaver CMA   Authorized by:   Roderick Pee MD   Signed by:   Lynann Beaver CMA on 06/01/2009   Method used:   Electronically to        [facility] [address] [phone]* (retail)       [address]       [place], [place]  [address]       Ph: [phone] or [phone]       Fax: [phone]   RxID:   [id number]  Pt notified.

## 2010-02-16 NOTE — Assessment & Plan Note (Signed)
Summary: REPEAT PAP/ALSO HIV TEST/ALSO DEPO INJ/NJR   Vital Signs:  Patient profile:   20 year old female Menstrual status:  irregular LMP:     08/10/2009 Weight:      124 pounds Temp:     98.2 degrees F oral BP sitting:   110 / 74  (left arm) Cuff size:   regular  Vitals Entered By: Kern Reap CMA Duncan Dull) (November 02, 2009 9:31 AM) CC: pap LMP (date): 08/10/2009     Menstrual Status irregular Enter LMP: 08/10/2009 Last PAP Result NEGATIVE FOR INTRAEPITHELIAL LESIONS OR MALIGNANCY.   CC:  pap.  History of Present Illness: Samantha Medina  is a 20 year old single female, nonsmoker, who comes in today for general physical examination and to discuss ADD and a different method of birth control.  Also, she would like some treatment for acne.  She takes Adderall 30 mg long-acting daily for ADD......... doing well in school.  Also working part-time at the [employer].  she has  occasional acne.  She would like some topical medication.  We saw her while back with a malodorous vaginal discharge.  We felt was Bv...Marland KitchenMarland KitchenMarland Kitchen she was given Flagyl, and the discharge and odor when away.  However, now is back to  She has been using the Depo every 90 days for birth control she would like to try another option.  She does not like the pills.  She would like to try the nuvaring   Last 06-06-22.  Her grandfather died he done dialysis for 32 years.  Recommended Judithe Modest for grief counseling  She is also complaining of dyspareunia  Allergies: No Known Drug Allergies  Past History:  Past medical, surgical, family and social histories (including risk factors) reviewed, and no changes noted (except as noted below).  Past Medical History: Reviewed history from 07/09/2007 and no changes required. Depression HPV abn pap PMH-FH-SH reviewed-no changes except otherwise noted  Family History: Reviewed history from 09/14/2006 and no changes required. Family History of Hypertension Fam hx  Asthma  Social History: Reviewed history from 01/22/2008 and no changes required. Negative history of passive tobacco smoke exposure.  Single Drug use-no Occupation: Alcohol use-no  Review of Systems      See HPI       Flu Vaccine Consent Questions     Do you have a history of severe allergic reactions to this vaccine? no    Any prior history of allergic reactions to egg and/or gelatin? no    Do you have a sensitivity to the preservative Thimersol? no    Do you have a past history of Guillan-Barre Syndrome? no    Do you currently have an acute febrile illness? no    Have you ever had a severe reaction to latex? no    Vaccine information given and explained to patient? yes    Are you currently pregnant? no    Lot Number:[id number]   Exp Date:07/17/2010   Site Given  Right  Deltoid IM   Physical Exam  General:  Well-developed,well-nourished,in no acute distress; alert,appropriate and cooperative throughout examination Head:  Normocephalic and atraumatic without obvious abnormalities. No apparent alopecia or balding. Eyes:  No corneal or conjunctival inflammation noted. EOMI. Perrla. Funduscopic exam benign, without hemorrhages, exudates or papilledema. Vision grossly normal. Ears:  External ear exam shows no significant lesions or deformities.  Otoscopic examination reveals clear canals, tympanic membranes are intact bilaterally without bulging, retraction, inflammation or discharge. Hearing is grossly normal bilaterally. Nose:  External nasal examination shows  no deformity or inflammation. Nasal mucosa are pink and moist without lesions or exudates. Mouth:  Oral mucosa and oropharynx without lesions or exudates.  Teeth in good repair. Neck:  No deformities, masses, or tenderness noted. Chest Wall:  No deformities, masses, or tenderness noted. Breasts:  No mass, nodules, thickening, tenderness, bulging, retraction, inflamation, nipple discharge or skin changes noted.   Lungs:   Normal respiratory effort, chest expands symmetrically. Lungs are clear to auscultation, no crackles or wheezes. Heart:  Normal rate and regular rhythm. S1 and S2 normal without gallop, murmur, click, rub or other extra sounds. Abdomen:  Bowel sounds positive,abdomen soft and non-tender without masses, organomegaly or hernias noted. Genitalia:  externa genitalia normal limits.  Vaginal vault was normal.  Cervix appears red and inflamed.  Bimanual exam negative Msk:  No deformity or scoliosis noted of thoracic or lumbar spine.   Pulses:  R and L carotid,radial,femoral,dorsalis pedis and posterior tibial pulses are full and equal bilaterally Extremities:  No clubbing, cyanosis, edema, or deformity noted with normal full range of motion of all joints.   Neurologic:  No cranial nerve deficits noted. Station and gait are normal. Plantar reflexes are down-going bilaterally. DTRs are symmetrical throughout. Sensory, motor and coordinative functions appear intact. Skin:  Intact without suspicious lesions or rashes Cervical Nodes:  No lymphadenopathy noted Axillary Nodes:  No palpable lymphadenopathy Inguinal Nodes:  No significant adenopathy Psych:  Cognition and judgment appear intact. Alert and cooperative with normal attention span and concentration. No apparent delusions, illusions, hallucinations   Impression & Recommendations:  Problem # 1:  CONTRACEPTIVE MANAGEMENT (ICD-V25.09) Assessment Deteriorated  Orders: Venipuncture (36644) TLB-BMP (Basic Metabolic Panel-BMET) (80048-METABOL) TLB-CBC Platelet - w/Differential (85025-CBCD) TLB-Hepatic/Liver Function Pnl (80076-HEPATIC) TLB-TSH (Thyroid Stimulating Hormone) (84443-TSH) T-HIV Antibody  (Reflex) ([id number]) Prescription Created Electronically [phone]) Specimen Handling (43329)  Problem # 2:  BACTERIAL VAGINITIS (ICD-616.10) Assessment: Deteriorated  Her updated medication list for this problem includes:    Zithromax 250 Mg Tabs  (Azithromycin) .Marland Kitchen... 2 tabs by mouth once and then 1 tab by mouth once daily x 4 days    Metronidazole 500 Mg Tabs (Metronidazole) .Marland Kitchen... 1 tab @ bedtime  Orders: Venipuncture (51884) TLB-BMP (Basic Metabolic Panel-BMET) (80048-METABOL) TLB-CBC Platelet - w/Differential (85025-CBCD) TLB-Hepatic/Liver Function Pnl (80076-HEPATIC) TLB-TSH (Thyroid Stimulating Hormone) (84443-TSH) T-HIV Antibody  (Reflex) ([id number]) Prescription Created Electronically ([phone])  Problem # 3:  PHYSICAL EXAMINATION (ICD-V70.0) Assessment: Unchanged  Orders: Venipuncture (35573) TLB-BMP (Basic Metabolic Panel-BMET) (80048-METABOL) TLB-CBC Platelet - w/Differential (85025-CBCD) TLB-Hepatic/Liver Function Pnl (80076-HEPATIC) TLB-TSH (Thyroid Stimulating Hormone) (84443-TSH) T-HIV Antibody  (Reflex) ([id number]) Prescription Created Electronically ([phone]) Specimen Handling (83151)  Complete Medication List: 1)  Adderall Xr 30 Mg Xr24h-cap (Amphetamine-dextroamphetamine) .... Take 1 tablet by mouth every morning 2)  Adderall Xr 30 Mg Xr24h-cap (Amphetamine-dextroamphetamine) .... Take one tab in the evening  fill in one month 3)  Adderall Xr 30 Mg Xr24h-cap (Amphetamine-dextroamphetamine) .... Take one tab in the evening   fill in two months 4)  Zithromax 250 Mg Tabs (Azithromycin) .... 2 tabs by mouth once and then 1 tab by mouth once daily x 4 days 5)  Nuvaring 0.12-0.015 Mg/24hr Ring (Etonogestrel-ethinyl estradiol) .... Uad 6)  Metronidazole 500 Mg Tabs (Metronidazole) .Marland Kitchen.. 1 tab @ bedtime  Other Orders: Admin 1st Vaccine (76160) Flu Vaccine 55yrs + (73710)  Patient Instructions: 1)  begin the nuvring....... 2)  We start the Flagyl, 500 mg daily x 10 days. 3)  Continue the Adderall 30 mg long-acting daily. 4)  You may apply  small amounts of the medicated ointment to the your acne lesions at bedtime.  Wash off in the morning.  Return in two weeks for follow-up Prescriptions: ADDERALL XR 30 MG  XR24H-CAP (AMPHETAMINE-DEXTROAMPHETAMINE) take one tab in the evening   fill in two months  #30 x 0   Entered and Authorized by:   Roderick Pee MD   Signed by:   Roderick Pee MD on 11/02/2009   Method used:   Print then Give to Patient   RxID:   [id number] ADDERALL XR 30 MG XR24H-CAP (AMPHETAMINE-DEXTROAMPHETAMINE) take one tab in the evening  fill in one month  #30 x 0   Entered and Authorized by:   Roderick Pee MD   Signed by:   Roderick Pee MD on 11/02/2009   Method used:   Print then Give to Patient   RxID:   [id number] ADDERALL XR 30 MG XR24H-CAP (AMPHETAMINE-DEXTROAMPHETAMINE) Take 1 tablet by mouth every morning  #30 x 0   Entered and Authorized by:   Roderick Pee MD   Signed by:   Roderick Pee MD on 11/02/2009   Method used:   Print then Give to Patient   RxID:   [id number] METRONIDAZOLE 500 MG TABS (METRONIDAZOLE) 1 tab @ bedtime  #10 x 1   Entered and Authorized by:   Roderick Pee MD   Signed by:   Roderick Pee MD on 11/02/2009   Method used:   Electronically to        [facility] [address] [phone]* (retail)       [address]       [place], [place]  [address]       Ph: [phone] or [phone]       Fax: [phone]   RxID:   [id number] NUVARING 0.12-0.015 MG/24HR RING (ETONOGESTREL-ETHINYL ESTRADIOL) UAD  #3 boxes x 4   Entered and Authorized by:   Roderick Pee MD   Signed by:   Roderick Pee MD on 11/02/2009   Method used:   Electronically to        [facility] [address] [phone]* (retail)       [address]       [place], [place]  [address]       Ph: [phone] or [phone]       Fax: [phone]   RxID:   [id number]    Orders Added: 1)  Venipuncture [71696] 2)  TLB-BMP (Basic Metabolic Panel-BMET) [80048-METABOL] 3)  TLB-CBC Platelet - w/Differential [85025-CBCD] 4)  TLB-Hepatic/Liver Function Pnl [80076-HEPATIC] 5)  TLB-TSH (Thyroid Stimulating Hormone) [84443-TSH] 6)   T-HIV Antibody  (Reflex) [[id number]] 7)  Prescription Created Electronically [G8553] 8)  Est. Patient 18-39 years [99395] 54)  Admin 1st Vaccine [90471] 10)  Flu Vaccine 39yrs + [10258] 11)  Specimen Handling [99000]

## 2010-02-16 NOTE — Progress Notes (Signed)
Summary: REQ FOR RX  Phone Note Call from Patient   Caller: Patient  [phone] Reason for Call: Refill Medication, Talk to Nurse, Talk to Doctor Summary of Call: Pt called in to req that an Rx be sent to [facility] [address] Pt adv that she is exp sxs that she believes to be a possible yeast infection... Pt  c/o strong odor from vaginal area, pt denies any irritation / itching / pain... Pt adv that there is an increased amount of discharge /  moisture in the area...Marland KitchenMarland Kitchen Pt adv that she just finished a taking Rx for abx recently (for treatment of UTI)..... Pt concerned she may have a yeast infection because of this?  Pt was offered an OV but declined stating she has already had a vaginal exam this year and doesn't want to go through it again.    Initial call taken by: Debbra Riding,  July 14, 2009 12:14 PM  Follow-up for Phone Call        generic flagyl 250  #30 one TID Follow-up by: Gordy Savers  MD,  July 14, 2009 12:59 PM  Additional Follow-up for Phone Call Additional follow up Details #1::        rx sent and patient is aware Additional Follow-up by: Kern Reap CMA Duncan Dull),  July 14, 2009 3:49 PM    New/Updated Medications: METRONIDAZOLE 250 MG TABS (METRONIDAZOLE) take one tab by mouth three times a day Prescriptions: METRONIDAZOLE 250 MG TABS (METRONIDAZOLE) take one tab by mouth three times a day  #30 x 0   Entered by:   Kern Reap CMA (AAMA)   Authorized by:   Gordy Savers  MD   Signed by:   Kern Reap CMA (AAMA) on 07/14/2009   Method used:   Electronically to        [facility] [address] #[id number]* (retail)       [address] [place], [place]  [address]       Ph: [phone]       Fax: [phone]   RxID:   [id number]

## 2010-02-16 NOTE — Letter (Signed)
Summary: Generic Letter  [place] [facility]  [address] [place], [place] [address]   Phone: [phone]  Fax: [phone]    07/03/2009  [name] [address] [place], [place]  [address]  To Whom it May Concern,   Our patient Samantha Medina was seen in our office 06-29-09.  She was diagnosed with hives which is not contagious.  If you have any questions or concerns please feel free to call our office.          Sincerely,   Kelle Darting, MD

## 2010-03-02 ENCOUNTER — Ambulatory Visit (INDEPENDENT_AMBULATORY_CARE_PROVIDER_SITE_OTHER): Payer: 59 | Admitting: Family Medicine

## 2010-03-02 ENCOUNTER — Encounter: Payer: Self-pay | Admitting: Family Medicine

## 2010-03-02 DIAGNOSIS — K625 Hemorrhage of anus and rectum: Secondary | ICD-10-CM

## 2010-03-02 DIAGNOSIS — F329 Major depressive disorder, single episode, unspecified: Secondary | ICD-10-CM

## 2010-03-02 MED ORDER — FLUOXETINE HCL 20 MG PO CAPS: 20.0000 mg | ORAL_CAPSULE | Freq: Every day | ORAL | Status: DC

## 2010-03-02 NOTE — Patient Instructions (Signed)
Begin Prozac 20 mg daily at bedtime.  We will do two to the GI folks ASAP.  See me in two weeks for follow-up.  Call Dr. Rennis Chris, your orthopedist, for consult about your niece

## 2010-03-02 NOTE — Progress Notes (Signed)
  Subjective:    Patient ID: Samantha Medina, female    DOB: 1990/07/17, 19 y.o.   MRN: [id number]  HPI Samantha Medina is a 53, single female, who comes in today for evaluation of 3 problems.  The most pressing problem is for the past 4 to 6, weeks.  She's had intermittent abdominal pain, constipation, diarrhea, and bloody bowel movements.  She's lost 10 pounds.  She also has soreness in her knees.  She said, arthroscopic surgery by Dr. Rennis Chris for cartilage problems in the past.  She's also had a long-standing history of anxiety and depression.   Review of Systems Negative    Objective:   Physical Exam    Well-developed well-nourished, female in no acute distress.  Examination of the abdomen shows the abdomen is soft.  The bowel sounds are normal.  The tenderness.  Liver, spleen, kidneys not palpable.  Rectal normal stool guaiac-negative    Assessment & Plan:  Rectal bleeding.   Bilateral knee pain.  Anxiety, depression.  Plan GI consult ASAP.  Daily, stool softener also begin Prozac because of the history of anxiety and depression.  Follow-up with me in two weeks

## 2010-03-05 ENCOUNTER — Telehealth (INDEPENDENT_AMBULATORY_CARE_PROVIDER_SITE_OTHER): Payer: Self-pay | Admitting: *Deleted

## 2010-03-11 ENCOUNTER — Encounter: Payer: Self-pay | Admitting: Family Medicine

## 2010-03-11 ENCOUNTER — Ambulatory Visit (INDEPENDENT_AMBULATORY_CARE_PROVIDER_SITE_OTHER): Payer: 59 | Admitting: Family Medicine

## 2010-03-11 VITALS — BP 120/80 | HR 72 | Temp 98.3°F | Wt 128.0 lb

## 2010-03-11 DIAGNOSIS — N760 Acute vaginitis: Secondary | ICD-10-CM

## 2010-03-11 MED ORDER — METRONIDAZOLE 500 MG PO TABS: 500.0000 mg | ORAL_TABLET | Freq: Two times a day (BID) | ORAL | Status: AC

## 2010-03-11 MED ORDER — FLUCONAZOLE 150 MG PO TABS: 150.0000 mg | ORAL_TABLET | Freq: Once | ORAL | Status: AC

## 2010-03-11 NOTE — Progress Notes (Signed)
  Subjective:    Patient ID: Samantha Medina, female    DOB: [birth year], 20 y.o.   MRN: [id number]  HPI  Patient seen with chief complaint of vaginal irritation. She's had several days of some itching , occasional pain and a whitish vaginal discharge. She took some type of home UTI test which was negative then took some type of yeast infection screen which was positive. Tried one day use of Monistat without improvement. Discharge is white to yellow. Denies any fever. No urinary symptoms.    patient is sexually active. Uses NuvaRing for birth control. Last menstrual period about 2 weeks ago. Denies history of STD.   Review of Systems No fever, chills, dysuria.    Objective:   Physical Exam  Healthy appearing 20 year old female  Chest clear to auscultation Heart regular rhythm and rate  pelvic exam reveals normal external genitalia. Vaginal mucosa is somewhat erythematous and she has thick white discharge more exterior and on speculum exam has thick yellowish discharge in the vaginal vault. Cervix is nontender to motion. Bimanual exam reveals no adnexal tenderness and no adnexal masses. Uterus normal size and nontender       Assessment & Plan:   vaginitis. Wet prep obtained. She has several clue cells indicative of bacterial vaginosis. Also a few possible hyphae elements seen. Start metronidazole 500 mg twice a day for 7 days and Diflucan 150 mg one-time dose. She knows not use any alcohol.

## 2010-03-11 NOTE — Patient Instructions (Signed)
Vaginitis     Vaginitis in a soreness, swelling and redness (inflammation) of the vagina and vulva. This is not a sexually transmitted infection.     CAUSES  Yeast vaginitis is caused by yeast (candida) that is normally found in your vagina. With a yeast infection, the candida has over grown in number to a point that upsets the chemical balance.     SYMPTOMS    White thick vaginal discharge.   Swelling, itching, redness and irritation of the vagina and possibly the lips of the vagina (vulva).   Burning or painful urination.   Painful intercourse.     HOME CARE INSTRUCTIONS   Finish all medication as prescribed.   Do not have sex until treatment is completed or instructed by your healthcare giver.   Take warm sitz baths.   Do not douche.   Do not use tampons, especially scented ones.   Wear cotton underwear.   Avoid tight pants and panty hose.   Tell your sexual partner that you have a yeast infection. They should go to their caregiver if they have symptoms such as mild rash or itching.    Your sexual partner should be treated if your infection is difficult to eliminate.   Practice safer sex. Use condoms.   Some vaginal medications cause latex condoms to fail. Ask your caregiver this.     SEEK MEDICAL CARE IF:   You develop a fever.   The infection is getting worse after 2 days of treatment.   The infection is not getting better after 3 days of treatment.   You develop blisters in or around your vagina.   You develop vaginal bleeding, and it is not your menstrual period.   You have pain when you urinate.   You develop intestinal problems.   You have pain with sexual intercourse.     Document Released: 02/11/2004  Document Re-Released: 10/31/2008  ExitCare Patient Information 2011 [employer].

## 2010-03-16 ENCOUNTER — Ambulatory Visit: Payer: 59 | Admitting: Family Medicine

## 2010-03-25 ENCOUNTER — Encounter: Payer: Self-pay | Admitting: Gastroenterology

## 2010-03-30 NOTE — Letter (Signed)
Summary: New Patient letter  [facility]  [address] [place], [place] [address]   Phone: [phone]  Fax: [phone]       03/25/2010 MRN: [id number]  Samantha Medina [address] [place], [place]  [address]  [place]  Dear Ms. Medina,  Welcome to the Gastroenterology Division at [facility].    You are scheduled to see Dr.  Jarold Motto on April 27, 2010 at 09:45am  on the 3rd floor at [facility], [address]  We ask that you try to arrive at our office 15 minutes prior to your appointment time to allow for check-in.  We would like you to complete the enclosed self-administered evaluation form prior to your visit and bring it with you on the day of your appointment.  We will review it with you.  Also, please bring a complete list of all your medications or, if you prefer, bring the medication bottles and we will list them.  Please bring your insurance card so that we may make a copy of it.  If your insurance requires a referral to see a specialist, please bring your referral form from your primary care physician.  Co-payments are due at the time of your visit and may be paid by cash, check or credit card.     Your office visit will consist of a consult with your physician (includes a physical exam), any laboratory testing he/she may order, scheduling of any necessary diagnostic testing (e.g. x-ray, ultrasound, CT-scan), and scheduling of a procedure (e.g. Endoscopy, Colonoscopy) if required.  Please allow enough time on your schedule to allow for any/all of these possibilities.    If you cannot keep your appointment, please call [phone] to cancel or reschedule prior to your appointment date.  This allows [place] the opportunity to schedule an appointment for another patient in need of care.  If you do not cancel or reschedule by 5 p.m. the business day prior to your appointment date, you will be charged a $50.00 late cancellation/no-show fee.     Thank you for choosing [facility] for your medical needs.  We appreciate the opportunity to care for you.  Please visit [place] at our website  to learn more about our practice.                     Sincerely,                                                             The Gastroenterology Division

## 2010-03-30 NOTE — Progress Notes (Signed)
Summary: Triage  Phone Note Call from Patient Call back at Home Phone [phone]   Caller: Barth Kirks @ [facility] Call For: DOD Reason for Call: Acute Illness, Talk to Nurse Details for Reason: Triage Summary of Call: Teri at [facility] [place] states patient needs to be worked in for an appointment, Pt saw Dr Tawanna Cooler and he suspects Crohns due to patient's symptoms, rectal bleeding and abd pain. Request we call patient directly with appointment details.  (Sent to Dr Jarold Motto- Doc of the Day) Initial call taken by: Dwan Bolt,  March 05, 2010 9:04 AM  Follow-up for Phone Call        [facility] for patient to return our call for an appointment. Patient's notes are in [facility]; patient saw Dr Tawanna Cooler on 03/02/10. Patient reported bloody BM's. diarrhea and abdominal pain x 4-6 weeks; 10lb weight loss also. Graciella Freer RN  March 05, 2010 10:49 AM    Spoke with patient to offer her an appointment with an extender this week and she stated she was working this week and requested a 03/15/10 appointment. Nothing was available so patient stated she will call back when she looks at her schedule. Graciella Freer RN  March 08, 2010 3:07 PM   Additional Follow-up for Phone Call Additional follow up Details #1::        Phoned patient because she never called me back. Patient stated she doesn't know her schedule and I informed her Dr Jarold Motto probably doesn't have anything until April. She stated to go ahead and schedule her for April- April 27, 2010 @ [id number]. I asked if she needs to be seen earlier and she stated no. Informed patient to call if she needs to be seen before her appointment and she stated understanding. Additional Follow-up by: Graciella Freer RN,  March 25, 2010 10:27 AM

## 2010-04-27 ENCOUNTER — Encounter: Payer: 59 | Admitting: Gastroenterology

## 2010-04-27 NOTE — Progress Notes (Signed)
This encounter was created in error - please disregard.

## 2010-04-29 LAB — URINALYSIS, ROUTINE W REFLEX MICROSCOPIC
Bilirubin Urine: NEGATIVE
Nitrite: NEGATIVE
Specific Gravity, Urine: 1.028 (ref 1.005–1.030)
Urobilinogen, UA: 1 mg/dL (ref 0.0–1.0)
pH: 6 (ref 5.0–8.0)

## 2010-04-29 LAB — POCT PREGNANCY, URINE: Preg Test, Ur: NEGATIVE

## 2010-05-11 ENCOUNTER — Telehealth: Payer: Self-pay | Admitting: Family Medicine

## 2010-05-11 MED ORDER — AMPHETAMINE-DEXTROAMPHET ER 30 MG PO CP24: 30.0000 mg | ORAL_CAPSULE | ORAL | Status: DC

## 2010-05-11 NOTE — Telephone Encounter (Signed)
Pt needs new rx adderall xr 30mg . Pt has only 2 pills left.

## 2010-05-11 NOTE — Telephone Encounter (Signed)
rx ready for pick up. Left message on machine for patient. 

## 2010-06-24 ENCOUNTER — Ambulatory Visit: Payer: 59 | Admitting: Family Medicine

## 2010-08-31 ENCOUNTER — Other Ambulatory Visit: Payer: Self-pay | Admitting: Family Medicine

## 2010-09-23 ENCOUNTER — Ambulatory Visit: Payer: 59 | Admitting: Family Medicine

## 2010-10-15 ENCOUNTER — Telehealth: Payer: Self-pay | Admitting: Family Medicine

## 2010-10-15 NOTE — Telephone Encounter (Signed)
Pt requesting refill on amphetamine-dextroamphetamine (ADDERALL XR) 30 MG 24 hr capsule °Please contact when ready to pick up. °

## 2010-10-18 ENCOUNTER — Encounter: Payer: Self-pay | Admitting: Family Medicine

## 2010-10-18 ENCOUNTER — Ambulatory Visit (INDEPENDENT_AMBULATORY_CARE_PROVIDER_SITE_OTHER): Payer: 59 | Admitting: Family Medicine

## 2010-10-18 VITALS — BP 120/80 | Temp 98.2°F | Ht 65.0 in | Wt 136.0 lb

## 2010-10-18 DIAGNOSIS — Z23 Encounter for immunization: Secondary | ICD-10-CM

## 2010-10-18 DIAGNOSIS — F988 Other specified behavioral and emotional disorders with onset usually occurring in childhood and adolescence: Secondary | ICD-10-CM

## 2010-10-18 DIAGNOSIS — F419 Anxiety disorder, unspecified: Secondary | ICD-10-CM | POA: Insufficient documentation

## 2010-10-18 DIAGNOSIS — F411 Generalized anxiety disorder: Secondary | ICD-10-CM

## 2010-10-18 MED ORDER — AMPHETAMINE-DEXTROAMPHET ER 30 MG PO CP24: 30.0000 mg | ORAL_CAPSULE | ORAL | Status: DC

## 2010-10-18 MED ORDER — LORAZEPAM 0.5 MG PO TABS: 0.5000 mg | ORAL_TABLET | Freq: Two times a day (BID) | ORAL | Status: DC

## 2010-10-18 NOTE — Progress Notes (Signed)
  Subjective:    Patient ID: Deretha Emory, female    DOB: 18-Aug-1990, 20 y.o.   MRN: [id number]  HPI Michaelene is a 20 year old single female, nonsmoker, who comes in today for evaluation of two problems.  We restarted her on her Adderall 30 mg long-acting daily, and she is doing much better.  She goes to school full time and works full time.  We also gave her Prozac to begin because of anxiety and slight depression.  However, she stopped it after 3 weeks.  She does not feel like she needs to take something on a daily basis.  She has episodes, but only once or twice a week.  In the fall short of the panic attack.  She used to have.  Review of systems otherwise negative   Review of Systems General psychiatric review of systems otherwise negative    Objective:   Physical Exam Well-developed well-nourished, female, no acute distress     Assessment & Plan:  Adult ADD.  Plan continue the long-acting Adderall 30 mg daily.  Anxiety.  Ativan .5 p.r.n. And brand recommended counseling with Judithe Modest.  Follow-up in 3 months

## 2010-10-18 NOTE — Patient Instructions (Signed)
Continue the Adderall long-acting 30 mg one tablet daily.  Take the Ativan .5 mg p.r.n.  Also begin an exercise program walking on a regular basis.  Seems to help decrease anxiety as much.  His medication.  Return for a 30 minute appointment in 3 months for complete physical exam.......Marland Kitchen

## 2010-10-18 NOTE — Telephone Encounter (Signed)
patient  Here for office visit today

## 2011-01-20 ENCOUNTER — Encounter: Payer: 59 | Admitting: Family Medicine

## 2011-02-15 ENCOUNTER — Other Ambulatory Visit: Payer: Self-pay | Admitting: *Deleted

## 2011-02-15 MED ORDER — ETONOGESTREL-ETHINYL ESTRADIOL 0.12-0.015 MG/24HR VA RING: VAGINAL_RING | VAGINAL | Status: DC

## 2011-02-17 ENCOUNTER — Other Ambulatory Visit: Payer: Self-pay | Admitting: *Deleted

## 2011-02-17 MED ORDER — ETONOGESTREL-ETHINYL ESTRADIOL 0.12-0.015 MG/24HR VA RING: VAGINAL_RING | VAGINAL | Status: DC

## 2011-03-15 ENCOUNTER — Telehealth: Payer: Self-pay | Admitting: Family Medicine

## 2011-03-15 MED ORDER — ETONOGESTREL-ETHINYL ESTRADIOL 0.12-0.015 MG/24HR VA RING: VAGINAL_RING | VAGINAL | Status: DC

## 2011-03-15 NOTE — Telephone Encounter (Signed)
Pt called and is req to get a script for 1 year supply with refills of etonogestrel-ethinyl estradiol (NUVARING) 0.12-0.015 MG/24HR vaginal ring. Pls call in to [facility] in [place]. Pt said that the pharmacy has sent a request for this also.

## 2011-03-15 NOTE — Telephone Encounter (Signed)
Appreciates the refill for Nuvaring but does not want to come back in for pap says she is fine wants to get the yearly refill. Also wants a refill on(ADDERALL XR) 30 MG. She says please call her if you need to.

## 2011-03-15 NOTE — Telephone Encounter (Signed)
Spoke with mom

## 2011-03-24 ENCOUNTER — Encounter: Payer: Self-pay | Admitting: Family Medicine

## 2011-03-24 ENCOUNTER — Ambulatory Visit (INDEPENDENT_AMBULATORY_CARE_PROVIDER_SITE_OTHER): Payer: 59 | Admitting: Family Medicine

## 2011-03-24 DIAGNOSIS — F988 Other specified behavioral and emotional disorders with onset usually occurring in childhood and adolescence: Secondary | ICD-10-CM

## 2011-03-24 DIAGNOSIS — F419 Anxiety disorder, unspecified: Secondary | ICD-10-CM

## 2011-03-24 DIAGNOSIS — F411 Generalized anxiety disorder: Secondary | ICD-10-CM

## 2011-03-24 LAB — CBC WITH DIFFERENTIAL/PLATELET
Basophils Absolute: 0 10*3/uL (ref 0.0–0.1)
Eosinophils Relative: 3.3 % (ref 0.0–5.0)
HCT: 40.1 % (ref 36.0–46.0)
Lymphs Abs: 2.3 10*3/uL (ref 0.7–4.0)
Monocytes Absolute: 0.4 10*3/uL (ref 0.1–1.0)
Monocytes Relative: 4.3 % (ref 3.0–12.0)
Neutrophils Relative %: 63.7 % (ref 43.0–77.0)
Platelets: 221 10*3/uL (ref 150.0–400.0)
RDW: 12.5 % (ref 11.5–14.6)
WBC: 8.3 10*3/uL (ref 4.5–10.5)

## 2011-03-24 LAB — BASIC METABOLIC PANEL
CO2: 27 mEq/L (ref 19–32)
Calcium: 9.3 mg/dL (ref 8.4–10.5)
Chloride: 104 mEq/L (ref 96–112)
Creatinine, Ser: 0.8 mg/dL (ref 0.4–1.2)
Sodium: 138 mEq/L (ref 135–145)

## 2011-03-24 LAB — TSH: TSH: 0.48 u[IU]/mL (ref 0.35–5.50)

## 2011-03-24 MED ORDER — AMPHETAMINE-DEXTROAMPHET ER 30 MG PO CP24: 30.0000 mg | ORAL_CAPSULE | ORAL | Status: DC

## 2011-03-24 MED ORDER — LORAZEPAM 0.5 MG PO TABS: 0.5000 mg | ORAL_TABLET | Freq: Two times a day (BID) | ORAL | Status: DC

## 2011-03-24 MED ORDER — ETONOGESTREL-ETHINYL ESTRADIOL 0.12-0.015 MG/24HR VA RING: VAGINAL_RING | VAGINAL | Status: DC

## 2011-03-24 NOTE — Progress Notes (Signed)
  Subjective:    Patient ID: Samantha Medina, female    DOB: [birth year], 21 y.o.   MRN: [id number]  HPI Nashay is a 21 year old single female nonsmoker who comes in today for evaluation of fatigue no energy mood swings question of thyroid disease  Both her mother and her grandmother have thyroid disease.  She takes Adderall 30 mg long-acting daily for adult ADD  She uses the NuvaRing every 3 weeks. Started last night we'll defer Pap  She says she doesn't feel well she is tired she has been swings but it doesn't sound like bipolar they're very temporary. 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 She has a boyfriend that relationship is okay.   Review of Systems    review of systems otherwise negative Objective:   Physical Exam Well-developed well-nourished female in no acute distress HEENT negative neck was supple no adenopathy lungs are clear cardiac exam normal breast exam normal abdominal exam normal skin normal pelvic deferred       Assessment & Plan:  Fatigue mood swings question thyroid disease plan check labs  Adult ADD continue Adderall 30 long-acting daily  Dysfunction uterine bleeding continue NuvaRing

## 2011-03-24 NOTE — Patient Instructions (Signed)
Returned the last Thursday in March around 4:30 in the afternoon for your Pap smear  Continue your current medications  Remember to get out and exercise 3-4 days weekly  We will call you the report on your lab work

## 2011-04-14 ENCOUNTER — Encounter: Payer: Self-pay | Admitting: Family Medicine

## 2011-04-14 ENCOUNTER — Other Ambulatory Visit (HOSPITAL_COMMUNITY)
Admission: RE | Admit: 2011-04-14 | Discharge: 2011-04-14 | Disposition: A | Discharge status: Home / Self Care | Payer: 59 | Source: Ambulatory Visit | Attending: Family Medicine | Admitting: Family Medicine

## 2011-04-14 ENCOUNTER — Ambulatory Visit (INDEPENDENT_AMBULATORY_CARE_PROVIDER_SITE_OTHER): Payer: 59 | Admitting: Family Medicine

## 2011-04-14 VITALS — Wt 128.0 lb

## 2011-04-14 DIAGNOSIS — N871 Moderate cervical dysplasia: Secondary | ICD-10-CM

## 2011-04-14 DIAGNOSIS — Z01419 Encounter for gynecological examination (general) (routine) without abnormal findings: Secondary | ICD-10-CM | POA: Insufficient documentation

## 2011-04-14 MED ORDER — ETONOGESTREL-ETHINYL ESTRADIOL 0.12-0.015 MG/24HR VA RING: VAGINAL_RING | VAGINAL | Status: DC

## 2011-04-14 NOTE — Progress Notes (Signed)
Addended by: Kern Reap B on: 04/14/2011 05:51 PM   Modules accepted: Orders

## 2011-04-14 NOTE — Progress Notes (Signed)
  Subjective:    Patient ID: Samantha Medina, female    DOB: 1990/02/20, 21 y.o.   MRN: [id number]  HPI Dan is a 21 year old single female nonsmoker who comes in today for a Pap smear  We saw her couple weeks ago for physical examination however at the time she was having her period therefore we deferred her Pap until today midcycle     Review of Systems    review of systems otherwise negative see physical note Objective:   Physical Exam Pelvic examination external genitalia within normal limits vaginal vault is normal cervix is visualized Pap smear was done there was some spotting bimanual exam negative       Assessment & Plan:  Normal pelvic examination refill BCPs return when necessary

## 2011-04-14 NOTE — Patient Instructions (Signed)
Continue your current good health habits return in one year sooner if any problems  Applied the over-the-counter miconazole antifungal cream small amounts twice daily until the skin is completely normal

## 2011-06-07 ENCOUNTER — Other Ambulatory Visit: Payer: Self-pay | Admitting: *Deleted

## 2011-06-07 NOTE — Telephone Encounter (Signed)
[facility] is calling because Ameia brought in a prescription for Adderall XR 30 that was dated 10-18-10.  They would like to know if this is okay to fill?

## 2011-06-07 NOTE — Telephone Encounter (Signed)
Spoke with pharmacy and it is time for a refill so rx refilled

## 2011-06-07 NOTE — Telephone Encounter (Signed)
Fleet Contras please call to clarify what's going on

## 2011-08-22 ENCOUNTER — Encounter: Payer: Self-pay | Admitting: Family Medicine

## 2011-08-22 ENCOUNTER — Ambulatory Visit (INDEPENDENT_AMBULATORY_CARE_PROVIDER_SITE_OTHER): Payer: 59 | Admitting: Family Medicine

## 2011-08-22 VITALS — BP 110/80 | Temp 98.4°F | Wt 133.0 lb

## 2011-08-22 DIAGNOSIS — F411 Generalized anxiety disorder: Secondary | ICD-10-CM

## 2011-08-22 DIAGNOSIS — L247 Irritant contact dermatitis due to plants, except food: Secondary | ICD-10-CM

## 2011-08-22 DIAGNOSIS — F988 Other specified behavioral and emotional disorders with onset usually occurring in childhood and adolescence: Secondary | ICD-10-CM

## 2011-08-22 DIAGNOSIS — L255 Unspecified contact dermatitis due to plants, except food: Secondary | ICD-10-CM

## 2011-08-22 DIAGNOSIS — F419 Anxiety disorder, unspecified: Secondary | ICD-10-CM

## 2011-08-22 MED ORDER — PREDNISONE 20 MG PO TABS: ORAL_TABLET | ORAL | Status: DC

## 2011-08-22 MED ORDER — AMPHETAMINE-DEXTROAMPHET ER 30 MG PO CP24: 30.0000 mg | ORAL_CAPSULE | ORAL | Status: DC

## 2011-08-22 NOTE — Patient Instructions (Addendum)
Take rx as directed rt prn 

## 2011-08-22 NOTE — Progress Notes (Signed)
  Subjective:    Patient ID: Samantha Medina, female    DOB: [birth year], 21 y.o.   MRN: [id number]  Samantha Medina is a 21 y/o who comes in today for eval of a rash x 2 days and to refill her adderall    Review of Systems neg    Objective:   Physical Exam wdwn fem. Nad.  Rash consistent w a cont. derm       Assessment & Plan:  Cont. Derm.,,,,pred Adult add,cont adderall

## 2011-10-19 ENCOUNTER — Emergency Department (HOSPITAL_COMMUNITY)
Admission: EM | Admit: 2011-10-19 | Discharge: 2011-10-19 | Disposition: A | Discharge status: Home / Self Care | Payer: 59 | Attending: Emergency Medicine | Admitting: Emergency Medicine

## 2011-10-19 ENCOUNTER — Encounter (HOSPITAL_COMMUNITY): Payer: Self-pay | Admitting: Family Medicine

## 2011-10-19 DIAGNOSIS — Z79899 Other long term (current) drug therapy: Secondary | ICD-10-CM | POA: Insufficient documentation

## 2011-10-19 DIAGNOSIS — K921 Melena: Secondary | ICD-10-CM | POA: Insufficient documentation

## 2011-10-19 DIAGNOSIS — K589 Irritable bowel syndrome without diarrhea: Secondary | ICD-10-CM | POA: Insufficient documentation

## 2011-10-19 LAB — CBC
MCH: 31.7 pg (ref 26.0–34.0)
MCHC: 34.6 g/dL (ref 30.0–36.0)
MCV: 91.7 fL (ref 78.0–100.0)
Platelets: 290 10*3/uL (ref 150–400)
RBC: 4.35 MIL/uL (ref 3.87–5.11)
RDW: 12.1 % (ref 11.5–15.5)

## 2011-10-19 LAB — COMPREHENSIVE METABOLIC PANEL
ALT: 10 U/L (ref 0–35)
AST: 15 U/L (ref 0–37)
Albumin: 3.7 g/dL (ref 3.5–5.2)
CO2: 23 mEq/L (ref 19–32)
Calcium: 9.2 mg/dL (ref 8.4–10.5)
Creatinine, Ser: 0.73 mg/dL (ref 0.50–1.10)
GFR calc non Af Amer: >90 mL/min (ref >90)
Sodium: 137 mEq/L (ref 135–145)
Total Protein: 7 g/dL (ref 6.0–8.3)

## 2011-10-19 LAB — TYPE AND SCREEN: Antibody Screen: NEGATIVE

## 2011-10-19 LAB — OCCULT BLOOD, POC DEVICE: Fecal Occult Bld: POSITIVE

## 2011-10-19 LAB — POCT PREGNANCY, URINE: Preg Test, Ur: NEGATIVE

## 2011-10-19 LAB — ABO/RH: ABO/RH(D): O POS

## 2011-10-19 MED ORDER — DICYCLOMINE HCL 10 MG PO CAPS
10.0000 mg | ORAL_CAPSULE | Freq: Once | ORAL | Status: AC
  Administered 2011-10-19: 10 mg via ORAL
  Filled 2011-10-19: qty 1

## 2011-10-19 MED ORDER — DICYCLOMINE HCL 20 MG PO TABS: 20.0000 mg | ORAL_TABLET | Freq: Two times a day (BID) | ORAL | Status: DC

## 2011-10-19 NOTE — ED Notes (Signed)
Pt reports last night had a lot of bright red blood in her stool with clots and started again today. Reports lower abdominal pain that is intermittent. Reports some nausea earlier but not now.

## 2011-10-19 NOTE — ED Provider Notes (Signed)
History     CSN: [id number]  Arrival date & time 10/19/11  1513   First MD Initiated Contact with Patient 10/19/11 1621      Chief Complaint  Patient presents with  . Rectal Bleeding  . Abdominal Pain    (Consider location/radiation/quality/duration/timing/severity/associated sxs/prior treatment) HPI  Pt to the ER with complaints of rectal bleeding that started today. She has a chronic abdominal pain for which she has been diagnosed by IBS by her PCP. She has not seen a GI doctor. The blood only passed when she was passing stool. The blood was wrapped around the stool and was approx 1 teaspoon. She admits she has had some blood before, but less than that. She denies her bowel movements being harder to pass than normal or being more painful than normal. She denies fevers, N/V/D or weakness. VSS/NAD  Past Medical History  Diagnosis Date  . Depression   . HPV in female     Past Surgical History  Procedure Date  . Knee surgery     Family History  Problem Relation Age of Onset  . Hypertension    . Asthma      History  Substance Use Topics  . Smoking status: Never Smoker   . Smokeless tobacco: Not on file  . Alcohol Use: Yes     occasionally    OB History    Grav Para Term Preterm Abortions TAB SAB Ect Mult Living                  Review of Systems   Review of Systems  Gen: no weight loss, fevers, chills, night sweats  Eyes: no discharge or drainage, no occular pain or visual changes  Nose: no epistaxis or rhinorrhea  Mouth: no dental pain, no sore throat  Neck: no neck pain  Lungs:No wheezing, coughing or hemoptysis CV: no chest pain, palpitations, dependent edema or orthopnea  Abd: + chronic abdominal pain (IBS), nausea, vomiting, hematochezia  GU: no dysuria or gross hematuria  MSK:  No abnormalities  Neuro: no headache, no focal neurologic deficits  Skin: no abnormalities Psyche: negative.    Allergies  Review of patient's allergies indicates no  known allergies.  Home Medications   Current Outpatient Rx  Name Route Sig Dispense Refill  . AMPHETAMINE-DEXTROAMPHET ER 30 MG PO CP24 Oral Take 30 mg by mouth every morning.    . ETONOGESTREL-ETHINYL ESTRADIOL 0.12-0.015 MG/24HR VA RING  Insert vaginally and leave in place for 3 consecutive weeks, then remove for 1 week. 3 each 3  . AMPHETAMINE-DEXTROAMPHET ER 30 MG PO CP24 Oral Take 30 mg by mouth every morning.    Marland Kitchen PREDNISONE 20 MG PO TABS  1 po x 3 days,1/2 po x 3 days, then 1/2 po qod x 2 weeks 30 tablet 1    BP 135/80  Pulse 113  Temp 98.2 F (36.8 C) (Oral)  Resp 18  SpO2 99%  LMP 10/05/2011  Physical Exam  Nursing note and vitals reviewed. Constitutional: She appears well-developed and well-nourished. No distress.  HENT:  Head: Normocephalic and atraumatic.  Eyes: Pupils are equal, round, and reactive to light.  Neck: Normal range of motion. Neck supple.  Cardiovascular: Normal rate and regular rhythm.   Pulmonary/Chest: Effort normal.  Abdominal: Soft. There is no tenderness.  Genitourinary: Rectal exam shows no external hemorrhoid, no internal hemorrhoid, no fissure, no mass, no tenderness and anal tone normal. Guaiac positive stool.  Neurological: She is alert.  Skin: Skin is  warm and dry.    ED Course  Procedures (including critical care time)  Labs Reviewed  COMPREHENSIVE METABOLIC PANEL - Abnormal; Notable for the following:    Total Bilirubin 0.2 (*)     All other components within normal limits  CBC  OCCULT BLOOD, POC DEVICE  TYPE AND SCREEN   No results found.   1. Hematochezia   2. IBS (irritable bowel syndrome)       MDM  Hemoccult positive but mildly so.Pt says she did not want to come but her mother made her come get evaluated. She has not seen a specialist but wants to. She says that she would rather do the CT scan as an outpatient as she would like to leave. I feel comfortable with this since she is not having abdominal pains. Labs are  unremarkable.  Will give Bentyl and GI referral. Pulse is now in the 80's. She is on Nuvaring and Adderall with a negative Urine preg.  Pt has been advised of the symptoms that warrant their return to the ED. Patient has voiced understanding and has agreed to follow-up with the PCP or specialist.        Dorthula Matas, PA 10/19/11 1753

## 2011-10-19 NOTE — ED Provider Notes (Signed)
Medical screening examination/treatment/procedure(s) were performed by non-physician practitioner and as supervising physician I was immediately available for consultation/collaboration.   Gwyneth Sprout, MD 10/19/11 2322

## 2011-10-20 ENCOUNTER — Encounter: Payer: Self-pay | Admitting: Gastroenterology

## 2011-10-31 ENCOUNTER — Encounter: Payer: Self-pay | Admitting: Gastroenterology

## 2011-10-31 ENCOUNTER — Other Ambulatory Visit: Payer: 59

## 2011-10-31 ENCOUNTER — Ambulatory Visit (INDEPENDENT_AMBULATORY_CARE_PROVIDER_SITE_OTHER): Payer: 59 | Admitting: Gastroenterology

## 2011-10-31 VITALS — BP 114/68 | HR 88 | Ht 63.5 in | Wt 134.0 lb

## 2011-10-31 DIAGNOSIS — R1084 Generalized abdominal pain: Secondary | ICD-10-CM

## 2011-10-31 DIAGNOSIS — K921 Melena: Secondary | ICD-10-CM

## 2011-10-31 DIAGNOSIS — R197 Diarrhea, unspecified: Secondary | ICD-10-CM

## 2011-10-31 DIAGNOSIS — R195 Other fecal abnormalities: Secondary | ICD-10-CM

## 2011-10-31 MED ORDER — HYOSCYAMINE SULFATE 0.125 MG SL SUBL: SUBLINGUAL_TABLET | SUBLINGUAL | Status: DC

## 2011-10-31 MED ORDER — PEG-KCL-NACL-NASULF-NA ASC-C 100 G PO SOLR: 1.0000 | Freq: Once | ORAL | Status: DC

## 2011-10-31 NOTE — Patient Instructions (Addendum)
Your physician has requested that you go to the basement for the following lab work before leaving today: Celiac panel.  We have sent the following medications to your pharmacy for you to pick up at your convenience: Levsin.  You have been given a Fodmap diet.   You have been scheduled for an endoscopy and colonoscopy with propofol. Please follow the written instructions given to you at your visit today. Please pick up your prep at the pharmacy within the next 1-3 days. If you use inhalers (even only as needed), please bring them with you on the day of your procedure.

## 2011-10-31 NOTE — Progress Notes (Signed)
History of Present Illness: This is a 21 year old female who has had a one-year history of generalized postprandial abdominal pain with urgent loose stools. She has noted intermittent bright red blood on the tissue paper and mixed with her stools. The bleeding has increased in severity and frequency recently. She also notes several times a month with dark tarry stools. She was evaluated in the emergency department. A rectal examination was unremarkable except for occult blood in her stool. Blood work was unremarkable. She has no specific food allergies but notes an intolerance to salads which worsened her symptoms. Beer which improves her symptoms. Denies weight loss, constipation, change in stool caliber, nausea, vomiting, dysphagia, reflux symptoms, chest pain.  Review of Systems: Pertinent positive and negative review of systems were noted in the above HPI section. All other review of systems were otherwise negative.  Current Medications, Allergies, Past Medical History, Past Surgical History, Family History and Social History were reviewed in [employer] record.  Physical Exam: General: Well developed , well nourished, no acute distress Head: Normocephalic and atraumatic Eyes:  sclerae anicteric, EOMI Ears: Normal auditory acuity Mouth: No deformity or lesions Neck: Supple, no masses or thyromegaly Lungs: Clear throughout to auscultation Heart: Regular rate and rhythm; no murmurs, rubs or bruits Abdomen: Soft, mild generalized tenderness to deep palpation without rebound or guarding and non distended. No masses, hepatosplenomegaly or hernias noted. Normal Bowel sounds Rectal: Deferred to colonoscopy, exam in the emergency department was unremarkable except for heme-positive stool Musculoskeletal: Symmetrical with no gross deformities  Skin: No lesions on visible extremities Pulses:  Normal pulses noted Extremities: No clubbing, cyanosis, edema or deformities  noted Neurological: Alert oriented x 4, grossly nonfocal Cervical Nodes:  No significant cervical adenopathy Inguinal Nodes: No significant inguinal adenopathy Psychological:  Alert and cooperative. Normal mood and affect  Assessment and Recommendations:  1. Generalized postprandial abdominal pain with postprandial diarrhea, intermittent hematochezia, intermittent melena and Hemoccult-positive stool. Rule out inflammatory bowel disease, hemorrhoids, ulcer disease, celiac disease. Obtain a celiac panel and begin hyoscyamine 1-2 before meals. Begin a FODMAP diet. Schedule colonoscopy and upper endoscopy. The risks, benefits, and alternatives to colonoscopy with possible biopsy and possible polypectomy were discussed with the patient and they consent to proceed. The risks, benefits, and alternatives to endoscopy with possible biopsy and possible dilation were discussed with the patient and they consent to proceed.

## 2011-11-01 LAB — CELIAC PANEL 10
Gliadin IgG: 8.5 U/mL (ref <20)
IgA: 253 mg/dL (ref 69–380)

## 2011-12-09 ENCOUNTER — Ambulatory Visit (AMBULATORY_SURGERY_CENTER): Payer: 59 | Admitting: Gastroenterology

## 2011-12-09 ENCOUNTER — Encounter: Payer: Self-pay | Admitting: Gastroenterology

## 2011-12-09 VITALS — BP 118/43 | HR 79 | Temp 98.1°F | Resp 16 | Ht 63.0 in | Wt 134.0 lb

## 2011-12-09 DIAGNOSIS — R195 Other fecal abnormalities: Secondary | ICD-10-CM

## 2011-12-09 DIAGNOSIS — K2980 Duodenitis without bleeding: Secondary | ICD-10-CM

## 2011-12-09 DIAGNOSIS — R197 Diarrhea, unspecified: Secondary | ICD-10-CM

## 2011-12-09 DIAGNOSIS — K921 Melena: Secondary | ICD-10-CM

## 2011-12-09 DIAGNOSIS — R1084 Generalized abdominal pain: Secondary | ICD-10-CM

## 2011-12-09 DIAGNOSIS — A048 Other specified bacterial intestinal infections: Secondary | ICD-10-CM

## 2011-12-09 HISTORY — PX: COLONOSCOPY WITH PROPOFOL: SHX5780

## 2011-12-09 HISTORY — PX: ESOPHAGOGASTRODUODENOSCOPY (EGD) WITH PROPOFOL: SHX5813

## 2011-12-09 MED ORDER — OMEPRAZOLE 20 MG PO CPDR: 20.0000 mg | DELAYED_RELEASE_CAPSULE | Freq: Every day | ORAL | Status: DC

## 2011-12-09 MED ORDER — SODIUM CHLORIDE 0.9 % IV SOLN: 500.0000 mL | INTRAVENOUS | Status: DC

## 2011-12-09 NOTE — Op Note (Signed)
[facility] [address] [place], [address]   COLONOSCOPY PROCEDURE REPORT  PATIENT: Samantha Medina, Samantha Medina  MR#: [id number] BIRTHDATE: 18-Dec-1990 , 21  yrs. old GENDER: Female ENDOSCOPIST: Meryl Dare, MD, [facility] REFERRED [id number] Shawnie Dapper, M.D. PROCEDURE DATE:  12/09/2011 PROCEDURE:   Colonoscopy, diagnostic ASA CLASS:   Class I INDICATIONS:Abdominal pain and hematochezia. MEDICATIONS: MAC sedation, administered by CRNA and propofol (Diprivan) 320mg  IV DESCRIPTION OF PROCEDURE:   After the risks benefits and alternatives of the procedure were thoroughly explained, informed consent was obtained.  A digital rectal exam revealed no abnormalities of the rectum.   The LB [id number]  endoscope was introduced through the anus and advanced to the terminal ileum which was intubated for a short distance. No adverse events experienced.   The quality of the prep was excellent, using MoviPrep  The instrument was then slowly withdrawn as the colon was fully examined.  COLON FINDINGS: A normal appearing cecum, ileocecal valve, and appendiceal orifice were identified.  The ascending, hepatic flexure, transverse, splenic flexure, descending, sigmoid colon and rectum appeared unremarkable.  No polyps or cancers were seen. The mucosa appeared normal in the terminal ileum.  Retroflexed views revealed no abnormalities. The time to cecum=1 minutes 50 seconds.  Withdrawal time=8 minutes 27 seconds.  The scope was withdrawn and the procedure completed.  COMPLICATIONS: There were no complications.  ENDOSCOPIC IMPRESSION: 1.   Normal colon 2.   Normal mucosa in the terminal ileum  RECOMMENDATIONS: 1.  Continue treatment for IBS 2.  Office appt in abour 6 weeks  eSigned:  Meryl Dare, MD, [facility] 12/09/2011 2:37 PM

## 2011-12-09 NOTE — Patient Instructions (Signed)
PRESCRIPTION SENT ELECTRONICALLY TO [facility] ON [address]. CALL DR. STARK'S OFFICE ON Monday TO ARRANGE AN APPOINTMENT IN 6 WEEKS.  YOU HAD AN ENDOSCOPIC PROCEDURE TODAY AT THE [facility]: Refer to the procedure report that was given to you for any specific questions about what was found during the examination.  If the procedure report does not answer your questions, please call your gastroenterologist to clarify.  If you requested that your care partner not be given the details of your procedure findings, then the procedure report has been included in a sealed envelope for you to review at your convenience later.  YOU SHOULD EXPECT: Some feelings of bloating in the abdomen. Passage of more gas than usual.  Walking can help get rid of the air that was put into your GI tract during the procedure and reduce the bloating. If you had a lower endoscopy (such as a colonoscopy or flexible sigmoidoscopy) you may notice spotting of blood in your stool or on the toilet paper. If you underwent a bowel prep for your procedure, then you may not have a normal bowel movement for a few days.  DIET: Your first meal following the procedure should be a light meal and then it is ok to progress to your normal diet.  A half-sandwich or bowl of soup is an example of a good first meal.  Heavy or fried foods are harder to digest and may make you feel nauseous or bloated.  Likewise meals heavy in dairy and vegetables can cause extra gas to form and this can also increase the bloating.  Drink plenty of fluids but you should avoid alcoholic beverages for 24 hours.  ACTIVITY: Your care partner should take you home directly after the procedure.  You should plan to take it easy, moving slowly for the rest of the day.  You can resume normal activity the day after the procedure however you should NOT DRIVE or use heavy machinery for 24 hours (because of the sedation medicines used during the test).    SYMPTOMS TO REPORT  IMMEDIATELY: A gastroenterologist can be reached at any hour.  During normal business hours, 8:30 AM to 5:00 PM Monday through Friday, call [phone].  After hours and on weekends, please call the GI answering service at [phone] who will take a message and have the physician on call contact you.   Following lower endoscopy (colonoscopy or flexible sigmoidoscopy):  Excessive amounts of blood in the stool  Significant tenderness or worsening of abdominal pains  Swelling of the abdomen that is new, acute  Fever of 100F or higher  Following upper endoscopy (EGD)  Vomiting of blood or coffee ground material  New chest pain or pain under the shoulder blades  Painful or persistently difficult swallowing  New shortness of breath  Fever of 100F or higher  Black, tarry-looking stools  FOLLOW UP: If any biopsies were taken you will be contacted by phone or by letter within the next 1-3 weeks.  Call your gastroenterologist if you have not heard about the biopsies in 3 weeks.  Our staff will call the home number listed on your records the next business day following your procedure to check on you and address any questions or concerns that you may have at that time regarding the information given to you following your procedure. This is a courtesy call and so if there is no answer at the home number and we have not heard from you through the emergency physician  on call, we will assume that you have returned to your regular daily activities without incident.  SIGNATURES/CONFIDENTIALITY: You and/or your care partner have signed paperwork which will be entered into your electronic medical record.  These signatures attest to the fact that that the information above on your After Visit Summary has been reviewed and is understood.  Full responsibility of the confidentiality of this discharge information lies with you and/or your care-partner.

## 2011-12-09 NOTE — Op Note (Signed)
[facility] [address] [place], [address]   ENDOSCOPY PROCEDURE REPORT  PATIENT: Samantha Medina, Samantha Medina  MR#: [id number] BIRTHDATE: 02-13-1990 , 21  yrs. old GENDER: Female ENDOSCOPIST: Meryl Dare, MD, Clementeen Graham REFERRED BY:  Roderick Pee, M.D. PROCEDURE DATE:  12/09/2011 PROCEDURE:  EGD w/ biopsy ASA CLASS:     Class I INDICATIONS:  abdominal pain MEDICATIONS: MAC sedation, administered by CRNA, There was residual sedation effect present from prior procedure, and propofol (Diprivan) 130mg  IV TOPICAL ANESTHETIC: none DESCRIPTION OF PROCEDURE: After the risks benefits and alternatives of the procedure were thoroughly explained, informed consent was obtained.  The [facility] GIF-H180 [id number] endoscope was introduced through the mouth and advanced to the second portion of the duodenum. Without limitations.  The instrument was slowly withdrawn as the mucosa was fully examined.  DUODENUM: Mild duodenal inflammation was found in the duodenal bulb. The duodenal mucosa showed no abnormalities in the 2nd part of the duodenum. STOMACH: Mild patchy erythema. Multiple biopsies taken. The stomach otherwise appeared normal. ESOPHAGUS: The mucosa of the esophagus appeared normal.  Retroflexed views revealed no abnormalities.     The scope was then withdrawn from the patient and the procedure completed.  COMPLICATIONS: There were no complications.  ENDOSCOPIC IMPRESSION: 1.   Duodenitis 2.   Mild gastric erythema-R/O gastritis  RECOMMENDATIONS: 1.  Await pathology results 2.  PPI qam: omeprazole 20 mg po qam, #30, 1 refill   eSigned:  Meryl Dare, MD, [facility] 12/09/2011 2:45 PM

## 2011-12-09 NOTE — Progress Notes (Signed)
Patient did not experience any of the following events: a burn prior to discharge; a fall within the facility; wrong site/side/patient/procedure/implant event; or a hospital transfer or hospital admission upon discharge from the facility. (G8907) Patient did not have preoperative order for IV antibiotic SSI prophylaxis. (G8918)  

## 2011-12-12 ENCOUNTER — Telehealth: Payer: Self-pay | Admitting: *Deleted

## 2011-12-12 NOTE — Telephone Encounter (Signed)
  Follow up Call-  Call back number 12/09/2011  Post procedure Call Back phone  # [phone]  Permission to leave phone message Yes     Patient questions:  Message left to call us if necessary.

## 2011-12-18 ENCOUNTER — Encounter: Payer: Self-pay | Admitting: Gastroenterology

## 2011-12-19 ENCOUNTER — Other Ambulatory Visit: Payer: Self-pay

## 2011-12-19 MED ORDER — BIS SUBCIT-METRONID-TETRACYC 140-125-125 MG PO CAPS: 3.0000 | ORAL_CAPSULE | Freq: Four times a day (QID) | ORAL | Status: DC

## 2011-12-20 ENCOUNTER — Telehealth: Payer: Self-pay | Admitting: Family Medicine

## 2011-12-20 NOTE — Telephone Encounter (Signed)
If she's having any GI problems then she needs to recontact the gastroenterologist

## 2011-12-20 NOTE — Telephone Encounter (Signed)
Pt went to [facility] dr re: IBS and was prescribed an abx. Pt believe that she now has a stomach virus and has had nausea and vomiting. Pt is wanting to see if Dr Tawanna Cooler can prescribe Phenergren to [facility] [address]

## 2012-01-04 ENCOUNTER — Telehealth: Payer: Self-pay | Admitting: Gastroenterology

## 2012-01-04 NOTE — Telephone Encounter (Signed)
Left message for patient to call back  

## 2012-01-04 NOTE — Telephone Encounter (Signed)
Patient called to report that she has what looks like acne since starting on Pylera, she also reports that she is very tired since taking pylera.  She will stop by the office for me to look at her rash.  She will come by later this pm

## 2012-01-12 NOTE — Telephone Encounter (Signed)
Patient never came by the office and has not returned any calls.  I will await a return call from her.

## 2012-02-08 ENCOUNTER — Telehealth: Payer: Self-pay | Admitting: Family Medicine

## 2012-02-08 NOTE — Telephone Encounter (Signed)
Spoke with patient and the AZO seems to help.  She will call back if no improvement.

## 2012-02-08 NOTE — Telephone Encounter (Signed)
Patient will need an office visit

## 2012-02-08 NOTE — Telephone Encounter (Signed)
Pt states she has had these UTI's so often, and her boyfriend is having surgery today. It will be very difficult for her to get in today, no way before 12. Pt would appreciate it you could give her a call to discuss this.Marland Kitchen

## 2012-02-08 NOTE — Telephone Encounter (Signed)
Pt states she has another UTI. (She's pretty sure) Pt would like to know if she can come in and give a UA so that she can get script for this. Pls advise. Pt states she is going to get Azo too.

## 2012-03-07 ENCOUNTER — Telehealth: Payer: Self-pay | Admitting: Family Medicine

## 2012-03-07 NOTE — Telephone Encounter (Signed)
Patient called stating that she need a refill of her adderall xr 30mg  1 poqd. Please assist.

## 2012-03-08 MED ORDER — AMPHETAMINE-DEXTROAMPHET ER 30 MG PO CP24: 30.0000 mg | ORAL_CAPSULE | ORAL | Status: DC

## 2012-03-08 NOTE — Telephone Encounter (Signed)
rx ready for pickup 

## 2012-03-20 ENCOUNTER — Other Ambulatory Visit: Payer: Self-pay | Admitting: Family Medicine

## 2012-04-05 ENCOUNTER — Ambulatory Visit (INDEPENDENT_AMBULATORY_CARE_PROVIDER_SITE_OTHER): Payer: 59 | Admitting: Family Medicine

## 2012-04-05 DIAGNOSIS — J069 Acute upper respiratory infection, unspecified: Secondary | ICD-10-CM

## 2012-04-05 NOTE — Progress Notes (Signed)
No chief complaint on file.   HPI:  Acute Visit for sore throat: -started: 2 days ago -symptoms:nasal congestion, sore throat, cough, drainage in throat -denies:fever, SOB, NVD, tooth pain, she was worried about strep because used to have it a lot -has tried: decongestant -sick contacts: boyfriend sick with a cold, no known strep exposure -Hx of: strep   ROS: See pertinent positives and negatives per HPI.  Past Medical History  Diagnosis Date  . Depression   . HPV in female     Family History  Problem Relation Age of Onset  . Hypertension Father   . Asthma Brother   . Hypothyroidism Mother     History   Social History  . Marital Status: Single    Spouse Name: N/A    Number of Children: N/A  . Years of Education: N/A   Occupational History  . Student    Social History Main Topics  . Smoking status: Current Some Day Smoker  . Smokeless tobacco: Never Used  . Alcohol Use: Yes     Comment: occasionally  . Drug Use: No  . Sexually Active: Not on file   Other Topics Concern  . Not on file   Social History Narrative  . No narrative on file    Current outpatient prescriptions:amphetamine-dextroamphetamine (ADDERALL XR) 30 MG 24 hr capsule, Take 1 capsule (30 mg total) by mouth every morning., Disp: 30 capsule, Rfl: 0;  amphetamine-dextroamphetamine (ADDERALL XR) 30 MG 24 hr capsule, Take 1 capsule (30 mg total) by mouth every morning., Disp: 30 capsule, Rfl: 0 amphetamine-dextroamphetamine (ADDERALL XR) 30 MG 24 hr capsule, Take 1 capsule (30 mg total) by mouth every morning., Disp: 30 capsule, Rfl: 0;  bismuth-metronidazole-tetracycline (PYLERA) 140-125-125 MG per capsule, Take 3 capsules by mouth 4 (four) times daily., Disp: 120 capsule, Rfl: 0;  hyoscyamine (LEVSIN SL) 0.125 MG SL tablet, Take 1-2 tablets by mouth before meals, Disp: 100 tablet, Rfl: 5 NUVARING 0.12-0.015 MG/24HR vaginal ring, INSERT VAGINALLY AND LEAVE IN PLACE FOR 3 CONSECUTIVE WEEKS, THEN REMOVE  FOR 1 WEEK, Disp: 1 each, Rfl: 1;  omeprazole (PRILOSEC) 20 MG capsule, Take 1 capsule (20 mg total) by mouth daily., Disp: 30 capsule, Rfl: 1  EXAM:  There were no vitals filed for this visit.  There is no weight on file to calculate BMI.  GENERAL: vitals reviewed and listed above, alert, oriented, appears well hydrated and in no acute distress  HEENT: atraumatic, conjunttiva clear, no obvious abnormalities on inspection of external nose and ears, normal appearance of ear canals and TMs, clear nasal congestion, mild post oropharyngeal erythema with PND, 1+ tonsillar edema with erythema and cryptic tonsils with tonsilith, no sinus TTP  NECK: no obvious masses on inspection  LUNGS: clear to auscultation bilaterally, no wheezes, rales or rhonchi, good air movement  CV: HRRR, no peripheral edema  MS: moves all extremities without noticeable abnormality  PSYCH: pleasant and cooperative, no obvious depression or anxiety  ASSESSMENT AND PLAN:  Discussed the following assessment and plan:  Upper respiratory infection  -rapid strep negative -likely viral URI, advised supportive care and return precautions, salt water gargles -Patient advised to return or notify a doctor immediately if symptoms worsen or persist or new concerns arise.  There are no Patient Instructions on file for this visit.   Kriste Basque R.

## 2012-05-14 ENCOUNTER — Other Ambulatory Visit: Payer: Self-pay | Admitting: Family Medicine

## 2012-07-03 ENCOUNTER — Telehealth: Payer: Self-pay | Admitting: Family Medicine

## 2012-07-03 MED ORDER — ETONOGESTREL-ETHINYL ESTRADIOL 0.12-0.015 MG/24HR VA RING: VAGINAL_RING | VAGINAL | Status: DC

## 2012-07-03 NOTE — Telephone Encounter (Signed)
appt made. Pt aware/kh

## 2012-07-03 NOTE — Telephone Encounter (Signed)
I have sent enough for 3 months.  She will need to schedule a physical with a pap for more refills.

## 2012-07-03 NOTE — Telephone Encounter (Signed)
Pt would like refill for NUVARING 0.12-0.015 MG/24HR vaginal ring. Pharm: [facility] [address] [place], [place]   Pt was also wanting to know does she need an office visit?

## 2012-07-10 ENCOUNTER — Other Ambulatory Visit (HOSPITAL_COMMUNITY)
Admission: RE | Admit: 2012-07-10 | Discharge: 2012-07-10 | Disposition: A | Discharge status: Home / Self Care | Payer: 59 | Source: Ambulatory Visit | Attending: Family Medicine | Admitting: Family Medicine

## 2012-07-10 ENCOUNTER — Encounter: Payer: Self-pay | Admitting: Family Medicine

## 2012-07-10 ENCOUNTER — Ambulatory Visit (INDEPENDENT_AMBULATORY_CARE_PROVIDER_SITE_OTHER): Payer: 59 | Admitting: Family Medicine

## 2012-07-10 VITALS — BP 110/80 | Temp 98.3°F | Ht 62.75 in | Wt 134.0 lb

## 2012-07-10 DIAGNOSIS — N63 Unspecified lump in unspecified breast: Secondary | ICD-10-CM

## 2012-07-10 DIAGNOSIS — Z01419 Encounter for gynecological examination (general) (routine) without abnormal findings: Secondary | ICD-10-CM | POA: Insufficient documentation

## 2012-07-10 DIAGNOSIS — B360 Pityriasis versicolor: Secondary | ICD-10-CM

## 2012-07-10 DIAGNOSIS — F988 Other specified behavioral and emotional disorders with onset usually occurring in childhood and adolescence: Secondary | ICD-10-CM

## 2012-07-10 MED ORDER — AMPHETAMINE-DEXTROAMPHET ER 30 MG PO CP24: 30.0000 mg | ORAL_CAPSULE | ORAL | Status: DC

## 2012-07-10 MED ORDER — ETONOGESTREL-ETHINYL ESTRADIOL 0.12-0.015 MG/24HR VA RING: VAGINAL_RING | VAGINAL | Status: DC

## 2012-07-10 MED ORDER — KETOCONAZOLE 2 % EX GEL: CUTANEOUS | Status: DC

## 2012-07-10 NOTE — Patient Instructions (Signed)
Apply small amounts of the ketoconazole twice daily to all these spots until they completely healed  Continue the NuvaRing  Continue the Adderall  Return in one year for general physical examination sooner if any problems  We will call you when we get the report on your Pap smear

## 2012-07-10 NOTE — Progress Notes (Signed)
  Subjective:    Patient ID: Samantha Medina, female    DOB: 11/29/1990, 22 y.o.   MRN: [id number]  HPI Tala is a 22 year old single female nonsmoker who comes in today for general physical examination  She's had a history of adult ADD and takes Adderall 30 mg daily  She takes NuvaRing for [facility]  She's had a history of tinea versicolor which we've treated in the past with Nizoral cream. It's now recurred. She also has a cystic lesion on her right breast this been present for many years.  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 Examination the skin shows tinea versicolor which involves the upper back and upper chest. All her freckles were checked carefully. They all appear benign      Assessment & Plan:  Healthy female  History of ADD continue Adderall 30 mg daily  NuvaRing for birth control  Tinea versicolor Nizoral cream

## 2012-08-14 ENCOUNTER — Other Ambulatory Visit: Payer: Self-pay

## 2013-01-24 ENCOUNTER — Telehealth: Payer: Self-pay | Admitting: Family Medicine

## 2013-01-24 MED ORDER — AMPHETAMINE-DEXTROAMPHET ER 30 MG PO CP24: 30.0000 mg | ORAL_CAPSULE | ORAL | Status: DC

## 2013-01-24 NOTE — Telephone Encounter (Signed)
Pt needs new rx for name brand only adderall xr 30 mg

## 2013-01-24 NOTE — Telephone Encounter (Signed)
Rx ready for pick up.  Left message on machine for patient. 

## 2013-05-24 ENCOUNTER — Telehealth: Payer: Self-pay | Admitting: Family Medicine

## 2013-05-24 MED ORDER — AMPHETAMINE-DEXTROAMPHET ER 30 MG PO CP24: 30.0000 mg | ORAL_CAPSULE | ORAL | Status: DC

## 2013-05-24 NOTE — Telephone Encounter (Signed)
Pt needs adderall xr 30 mg

## 2013-05-24 NOTE — Telephone Encounter (Signed)
Rx ready for pick up and patient is aware 

## 2013-07-01 ENCOUNTER — Encounter: Payer: Self-pay | Admitting: Family Medicine

## 2013-07-01 ENCOUNTER — Ambulatory Visit (INDEPENDENT_AMBULATORY_CARE_PROVIDER_SITE_OTHER): Payer: 59 | Admitting: Family Medicine

## 2013-07-01 ENCOUNTER — Other Ambulatory Visit (HOSPITAL_COMMUNITY)
Admission: RE | Admit: 2013-07-01 | Discharge: 2013-07-01 | Disposition: A | Discharge status: Home / Self Care | Payer: 59 | Source: Ambulatory Visit | Attending: Family Medicine | Admitting: Family Medicine

## 2013-07-01 VITALS — BP 110/80 | Temp 98.2°F | Wt 128.0 lb

## 2013-07-01 DIAGNOSIS — Z01419 Encounter for gynecological examination (general) (routine) without abnormal findings: Secondary | ICD-10-CM

## 2013-07-01 DIAGNOSIS — L255 Unspecified contact dermatitis due to plants, except food: Secondary | ICD-10-CM

## 2013-07-01 DIAGNOSIS — F909 Attention-deficit hyperactivity disorder, unspecified type: Secondary | ICD-10-CM

## 2013-07-01 DIAGNOSIS — F988 Other specified behavioral and emotional disorders with onset usually occurring in childhood and adolescence: Secondary | ICD-10-CM

## 2013-07-01 DIAGNOSIS — B360 Pityriasis versicolor: Secondary | ICD-10-CM

## 2013-07-01 DIAGNOSIS — Z1151 Encounter for screening for human papillomavirus (HPV): Secondary | ICD-10-CM | POA: Insufficient documentation

## 2013-07-01 DIAGNOSIS — Z124 Encounter for screening for malignant neoplasm of cervix: Secondary | ICD-10-CM | POA: Insufficient documentation

## 2013-07-01 DIAGNOSIS — IMO0001 Reserved for inherently not codable concepts without codable children: Secondary | ICD-10-CM

## 2013-07-01 DIAGNOSIS — L247 Irritant contact dermatitis due to plants, except food: Secondary | ICD-10-CM

## 2013-07-01 DIAGNOSIS — Z309 Encounter for contraceptive management, unspecified: Secondary | ICD-10-CM

## 2013-07-01 DIAGNOSIS — N63 Unspecified lump in unspecified breast: Secondary | ICD-10-CM

## 2013-07-01 DIAGNOSIS — R87810 Cervical high risk human papillomavirus (HPV) DNA test positive: Secondary | ICD-10-CM | POA: Insufficient documentation

## 2013-07-01 MED ORDER — TRIAMCINOLONE ACETONIDE 0.025 % EX OINT: 1.0000 "application " | TOPICAL_OINTMENT | Freq: Two times a day (BID) | CUTANEOUS | Status: DC

## 2013-07-01 MED ORDER — ETONOGESTREL-ETHINYL ESTRADIOL 0.12-0.015 MG/24HR VA RING: VAGINAL_RING | VAGINAL | Status: DC

## 2013-07-01 MED ORDER — PREDNISONE 20 MG PO TABS: ORAL_TABLET | ORAL | Status: DC

## 2013-07-01 NOTE — Patient Instructions (Signed)
Use small amounts of triamcinolone gel and udder cream 3 times daily  If this does not work or the rash gets worse prednisone 20 mg,,,,,,,,,, 1 tab x5 days, a half a tab x5 days, then a half a tablet Monday Wednesday Friday for a two-week taper  Return in one year sooner if any problems

## 2013-07-01 NOTE — Progress Notes (Signed)
Pre visit review using our clinic review tool, if applicable. No additional management support is needed unless otherwise documented below in the visit note. 

## 2013-07-01 NOTE — Progress Notes (Signed)
   Subjective:    Patient ID: Samantha Medina, female    DOB: 07/19/1990, 23 y.o.   MRN: [id number]  HPI Samantha Medina is a delightful 23 year old single female nonsmoker who lives with her boyfriend in [place] who comes in today for general checkup  She takes Adderall 30 mg XR daily because of a history of adult ADD. She uses a NuvaRing for birth control  She also has poison ivy.   Review of Systems  Constitutional: Negative.   HENT: Negative.   Eyes: Negative.   Respiratory: Negative.   Cardiovascular: Negative.   Gastrointestinal: Negative.   Genitourinary: Negative.   Musculoskeletal: Negative.   Neurological: Negative.   Psychiatric/Behavioral: Negative.        Objective:   Physical Exam  Nursing note and vitals reviewed. Constitutional: She appears well-developed and well-nourished.  HENT:  Head: Normocephalic and atraumatic.  Right Ear: External ear normal.  Left Ear: External ear normal.  Nose: Nose normal.  Mouth/Throat: Oropharynx is clear and moist.  Eyes: EOM are normal. Pupils are equal, round, and reactive to light.  Neck: Normal range of motion. Neck supple. No thyromegaly present.  Cardiovascular: Normal rate, regular rhythm, normal heart sounds and intact distal pulses.  Exam reveals no gallop and no friction rub.   No murmur heard. Pulmonary/Chest: Effort normal and breath sounds normal.  Abdominal: Soft. Bowel sounds are normal. She exhibits no distension and no mass. There is no tenderness. There is no rebound.  Genitourinary: Vagina normal and uterus normal. Guaiac negative stool. No vaginal discharge found.  Bilateral breast exam shows cystic lesions left breast 3:00 an inch from the nipple. It been there for many years. Soft rubbery movable tender  Musculoskeletal: Normal range of motion.  Lymphadenopathy:    She has no cervical adenopathy.  Neurological: She is alert. She has normal reflexes. No cranial nerve deficit. She exhibits normal  muscle tone. Coordination normal.  Skin: Skin is warm and dry.  Contact dermatitis upper arms  Psychiatric: She has a normal mood and affect. Her behavior is normal. Judgment and thought content normal.          Assessment & Plan:  Healthy female  Contact dermatitis steroid gel  Adult ADD continue Adderall  DU B. continue the NuvaRing

## 2013-07-02 ENCOUNTER — Telehealth: Payer: Self-pay | Admitting: Family Medicine

## 2013-07-02 LAB — CYTOLOGY - PAP

## 2013-07-02 NOTE — Telephone Encounter (Signed)
Relevant patient education mailed to patient.  

## 2013-07-26 ENCOUNTER — Other Ambulatory Visit: Payer: Self-pay | Admitting: Family Medicine

## 2013-11-25 ENCOUNTER — Telehealth: Payer: Self-pay | Admitting: Family Medicine

## 2013-11-25 MED ORDER — AMPHETAMINE-DEXTROAMPHET ER 30 MG PO CP24: 30.0000 mg | ORAL_CAPSULE | ORAL | Status: DC

## 2013-11-25 NOTE — Telephone Encounter (Signed)
rx ready for pick up and patient is aware  

## 2013-11-25 NOTE — Telephone Encounter (Signed)
Pt request refill amphetamine-dextroamphetamine (ADDERALL XR) 30 MG 24 hr capsule Pt is out of meds. Would like asap. 3 mo supply, but pt has  12/31/13

## 2013-12-30 ENCOUNTER — Ambulatory Visit: Payer: 59 | Admitting: Family Medicine

## 2013-12-31 ENCOUNTER — Ambulatory Visit: Payer: 59 | Admitting: Family Medicine

## 2014-01-07 ENCOUNTER — Ambulatory Visit: Payer: 59 | Admitting: Family Medicine

## 2014-02-17 ENCOUNTER — Other Ambulatory Visit: Payer: Self-pay | Admitting: Family Medicine

## 2014-02-17 ENCOUNTER — Telehealth: Payer: Self-pay | Admitting: Family Medicine

## 2014-02-17 DIAGNOSIS — M25579 Pain in unspecified ankle and joints of unspecified foot: Secondary | ICD-10-CM

## 2014-02-17 NOTE — Telephone Encounter (Signed)
Pt said she has been seeing a foot doctor and with her insurance change she will need a referral ..      [name] is the doctor

## 2014-02-17 NOTE — Telephone Encounter (Signed)
Referral placed.

## 2014-02-19 ENCOUNTER — Telehealth: Payer: Self-pay | Admitting: Family Medicine

## 2014-02-19 NOTE — Telephone Encounter (Signed)
Thank you for your online Referral submission. Your referral case information was transmitted on 02/19/2014 at 08:01 AM CST Your Referral Number is [id number] [facility] .... Lurena JoinerRebecca bassette appt 02-20-2014 [facility]  Address: [address], [place], [place] [address]  [phone]

## 2014-02-20 ENCOUNTER — Encounter: Payer: Self-pay | Admitting: Family Medicine

## 2014-02-20 ENCOUNTER — Ambulatory Visit: Payer: Self-pay | Admitting: Family Medicine

## 2014-03-06 ENCOUNTER — Telehealth: Payer: Self-pay | Admitting: *Deleted

## 2014-03-06 DIAGNOSIS — M25579 Pain in unspecified ankle and joints of unspecified foot: Secondary | ICD-10-CM

## 2014-03-06 NOTE — Telephone Encounter (Signed)
Called and spoke with pt and pt states at this point she does want to see Dr. Victorino DikeHewitt.  Referral ordered.

## 2014-03-06 NOTE — Telephone Encounter (Signed)
Please call patient and see if she would like to be referred to Dr Victorino DikeHewitt per Dr Bassett/Dr Tawanna Coolerodd. If so okay to refer. thanks

## 2014-04-28 ENCOUNTER — Telehealth: Payer: Self-pay | Admitting: Family Medicine

## 2014-04-28 MED ORDER — AMPHETAMINE-DEXTROAMPHET ER 30 MG PO CP24: 30.0000 mg | ORAL_CAPSULE | ORAL | Status: DC

## 2014-04-28 NOTE — Telephone Encounter (Signed)
Rx ready for pick up and patient is aware 

## 2014-04-28 NOTE — Telephone Encounter (Signed)
Patient is requesting re-fill on amphetamine-dextroamphetamine (ADDERALL XR) 30 MG 24 hr capsule

## 2014-08-13 ENCOUNTER — Telehealth: Payer: Self-pay | Admitting: Family Medicine

## 2014-08-13 MED ORDER — AMPHETAMINE-DEXTROAMPHET ER 30 MG PO CP24: 30.0000 mg | ORAL_CAPSULE | ORAL | Status: DC

## 2014-08-13 NOTE — Telephone Encounter (Signed)
° ° ° ° ° °  Pt request refill of the following: ° °amphetamine-dextroamphetamine (ADDERALL XR) 30 MG 24 hr capsule ° ° °Phamacy: °

## 2014-08-13 NOTE — Telephone Encounter (Signed)
Rx ready for pick up and patient is aware 

## 2014-08-16 ENCOUNTER — Ambulatory Visit (INDEPENDENT_AMBULATORY_CARE_PROVIDER_SITE_OTHER): Payer: 59 | Admitting: Internal Medicine

## 2014-08-16 VITALS — BP 126/70 | HR 100 | Temp 98.7°F | Resp 16 | Ht 63.0 in | Wt 136.6 lb

## 2014-08-16 DIAGNOSIS — L600 Ingrowing nail: Secondary | ICD-10-CM | POA: Diagnosis not present

## 2014-08-16 MED ORDER — CEPHALEXIN 500 MG PO CAPS: 500.0000 mg | ORAL_CAPSULE | Freq: Three times a day (TID) | ORAL | Status: DC

## 2014-08-16 NOTE — Progress Notes (Signed)
Procedure Consent obtained. 4 1/2 cc 1% lido digital block. 1/3 oenail lifted and clipped. Pulled out with hemostats. Small amount of purulence expressed. Irrigated. Xeroform and clean dressing placed. Care instructions discussed.

## 2014-08-16 NOTE — Progress Notes (Addendum)
   Subjective:    Patient ID: Samantha Medina, female    DOB: 07-30-1990, 24 y.o.   MRN: [id number] This chart was scribed for Samantha Sia, MD by Jolene Provost, Medical Scribe. This patient was seen in Room 13 and the patient's care was started a 1:39 PM.  Chief Complaint  Patient presents with  . pain in left toe nail    x 2 days    HPI HPI Comments: Samantha Medina is a 24 y.o. female who presents to [facility] complaining of pain in her left toenail for the last 48 hours. Pt states she has a past hx of ingrown toenails, and has had her toenails incised multiple times in the past-altho not this one. She tried to cut illness or self and created more pain. There has been a little pus discharge.   Pt is not allergic to any medications.  Pt has no other health issues. PCP Dr. Tawanna Cooler Past medical issues listed are resolved for the most part.   Review of Systems  Constitutional: Negative for fever and chills.  Musculoskeletal: Negative for gait problem.       Objective:   Physical Exam  Constitutional: She is oriented to person, place, and time. She appears well-developed and well-nourished. No distress.  HENT:  Head: Normocephalic and atraumatic.  Eyes: Pupils are equal, round, and reactive to light.  Neck: Neck supple.  Cardiovascular: Normal rate.   Pulmonary/Chest: Effort normal. No respiratory distress.  Musculoskeletal: Normal range of motion.  Neurological: She is alert and oriented to person, place, and time. Coordination normal.  Skin: Skin is warm and dry. She is not diaphoretic.  The left toe great has ingrown toenail on the lateral aspect redness and tenderness / expressible pus  Psychiatric: She has a normal mood and affect. Her behavior is normal.  Nursing note and vitals reviewed. BP 126/70 mmHg  Pulse 100  Temp(Src) 98.7 F (37.1 C) (Oral)  Resp 16  Ht  (1.6 m)  Wt 136 lb 9.6 oz (61.961 kg)  BMI 24.20 kg/m2  SpO2 98%  Procedure by APP T Brewington       Assessment & Plan:  Nail, ingrown w/ 2 infec Meds ordered this encounter  Medications  . cephALEXin (KEFLEX) 500 MG capsule    Sig: Take 1 capsule (500 mg total) by mouth 3 (three) times daily.    Dispense:  21 capsule    Refill:  0   F/u 2 d  I have completed the patient encounter in its entirety as documented by the scribe, with editing by me where necessary. Judith Campillo P. Merla Riches, M.D.

## 2014-08-18 ENCOUNTER — Ambulatory Visit (INDEPENDENT_AMBULATORY_CARE_PROVIDER_SITE_OTHER): Payer: 59 | Admitting: Physician Assistant

## 2014-08-18 VITALS — BP 126/68 | HR 97 | Temp 98.2°F | Resp 16 | Ht 63.0 in | Wt 138.0 lb

## 2014-08-18 DIAGNOSIS — L600 Ingrowing nail: Secondary | ICD-10-CM

## 2014-08-18 NOTE — Progress Notes (Signed)
Chief Complaint  Patient presents with  . Wound Check    left 1st toe    History of Present Illness: Patient presents for wound check after wedge resection of the LEFT great toenail on 08/16/2014. She's doing well, having less pain. Swelling and redness much improved. Minimal drainage. Tolerating cephalexin without adverse effects.   No Known Allergies  Prior to Admission medications   Medication Sig Start Date End Date Taking? Authorizing Provider  amphetamine-dextroamphetamine (ADDERALL XR) 30 MG 24 hr capsule Take 1 capsule (30 mg total) by mouth every morning. 08/13/14 11/24/15 Yes Gordy Savers, MD  cephALEXin (KEFLEX) 500 MG capsule Take 1 capsule (500 mg total) by mouth 3 (three) times daily. 08/16/14  Yes Tonye Pearson, MD  NUVARING 0.12-0.015 MG/24HR vaginal ring INSERT VAGINALLY AND LEAVE IN PLACE FOR 3 WEEKS THEN REMOVE FOR 1 WEEK   Yes Roderick Pee, MD    Patient Active Problem List   Diagnosis Date Noted  . Tinea versicolor 07/10/2012  . BREAST MASS, LEFT 02/16/2009  . 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 She appears well-developed and well-nourished. She is active and cooperative. No distress.  BP 126/68 mmHg  Pulse 97  Temp(Src) 98.2 F (36.8 C) (Oral)  Resp 16  Ht  (1.6 m)  Wt 138 lb (62.596 kg)  BMI 24.45 kg/m2  SpO2 98%   Eyes: Conjunctivae are normal.  Pulmonary/Chest: Effort normal.  Neurological: She is alert and oriented to person, place, and time.  Skin: Skin is warm and dry.  Minimal erythema of the nail fold. No swelling. Mildly tender. Xeroform remains in place. Neurovascularly intact.  Psychiatric: She has a normal mood and affect. Her speech is normal and behavior is normal.      ASSESSMENT & PLAN:  1. Nail, ingrown S/p wedge excision. Continue antibiotic to completion and daily wound care. Anticipatory guidance provided.   Fernande Bras, PA-C Physician Assistant-Certified [facility]

## 2014-08-23 ENCOUNTER — Other Ambulatory Visit: Payer: Self-pay | Admitting: Family Medicine

## 2014-09-18 ENCOUNTER — Encounter: Payer: Self-pay | Admitting: Family Medicine

## 2014-09-18 ENCOUNTER — Telehealth: Payer: Self-pay

## 2014-09-18 ENCOUNTER — Ambulatory Visit (INDEPENDENT_AMBULATORY_CARE_PROVIDER_SITE_OTHER): Payer: 59 | Admitting: Family Medicine

## 2014-09-18 VITALS — BP 118/71 | HR 95 | Temp 98.2°F | Ht 63.0 in | Wt 139.0 lb

## 2014-09-18 DIAGNOSIS — H00016 Hordeolum externum left eye, unspecified eyelid: Secondary | ICD-10-CM

## 2014-09-18 MED ORDER — TOBRAMYCIN-DEXAMETHASONE 0.3-0.1 % OP SUSP: 2.0000 [drp] | OPHTHALMIC | Status: DC

## 2014-09-18 NOTE — Progress Notes (Signed)
   Subjective:    Patient ID: Samantha Medina, female    DOB: 11-04-1990, 24 y.o.   MRN: [id number]  HPI Here for several days of soreness and swelling around the left lower eyelid. The eye itself is fine. Vision is normal. No sinus sx.    Review of Systems  Constitutional: Negative.   HENT: Negative.   Eyes: Negative.   Respiratory: Negative.        Objective:   Physical Exam  Constitutional: She appears well-developed and well-nourished.  HENT:  Right Ear: External ear normal.  Left Ear: External ear normal.  Nose: Nose normal.  Mouth/Throat: Oropharynx is clear and moist.  Eyes: Conjunctivae and EOM are normal. Pupils are equal, round, and reactive to light.  Left lower eyelid is slightly swollen and is tender. No masses felt   Neck: Neck supple.  Lymphadenopathy:    She has no cervical adenopathy.          Assessment & Plan:  Early stye. Use warm compresses, avoid wearing eyeliner for a few days. Add Tobradex drops

## 2014-09-18 NOTE — Progress Notes (Signed)
Pre visit review using our clinic review tool, if applicable. No additional management support is needed unless otherwise documented below in the visit note. 

## 2014-09-18 NOTE — Telephone Encounter (Signed)
Patient states that her left eye is swollen and hurting. States that she has had this before. She wants a medication called to her pharmacy or a recommendation of an OTC treatment that may help. Explained that she most likely will need to have an OV to be evaluated by a medical professional to determine the most appropriate treatment. Patient really does not want to have to come in. She is also getting married Sunday and wants her eye to get better by then.   [phone]

## 2014-09-18 NOTE — Telephone Encounter (Signed)
Reviewed pt's chart before calling. Pt went to [facility] today after calling our office for treatment of stye of left eye.

## 2014-10-01 ENCOUNTER — Other Ambulatory Visit: Payer: Self-pay | Admitting: Orthopedic Surgery

## 2014-11-18 DIAGNOSIS — S99921A Unspecified injury of right foot, initial encounter: Secondary | ICD-10-CM

## 2014-11-18 HISTORY — DX: Unspecified injury of right foot, initial encounter: S99.921A

## 2014-11-27 ENCOUNTER — Telehealth: Payer: Self-pay | Admitting: Family Medicine

## 2014-11-27 MED ORDER — ETONOGESTREL-ETHINYL ESTRADIOL 0.12-0.015 MG/24HR VA RING: VAGINAL_RING | VAGINAL | Status: DC

## 2014-11-27 NOTE — Telephone Encounter (Signed)
Pt needs new rx nuva ring #3 for 90 day supply send to [facility] on [address]. Pt no longer use [facility]

## 2014-11-28 ENCOUNTER — Telehealth: Payer: Self-pay | Admitting: Family Medicine

## 2014-11-28 ENCOUNTER — Other Ambulatory Visit: Payer: Self-pay | Admitting: *Deleted

## 2014-11-28 MED ORDER — ETONOGESTREL-ETHINYL ESTRADIOL 0.12-0.015 MG/24HR VA RING: VAGINAL_RING | VAGINAL | Status: DC

## 2014-11-28 NOTE — Telephone Encounter (Signed)
Pt [facility] . Pt said she can not pick up rx from [facility]. She is requesting that the following rx be sent to [facility]   etonogestrel-ethinyl estradiol (NUVARING) 0.12-0.015 MG/24HR vaginal ring  [facility] [address]

## 2014-12-09 ENCOUNTER — Encounter (HOSPITAL_BASED_OUTPATIENT_CLINIC_OR_DEPARTMENT_OTHER): Payer: Self-pay | Admitting: *Deleted

## 2014-12-18 ENCOUNTER — Ambulatory Visit (HOSPITAL_BASED_OUTPATIENT_CLINIC_OR_DEPARTMENT_OTHER): Payer: 59 | Admitting: Certified Registered"

## 2014-12-18 ENCOUNTER — Ambulatory Visit (HOSPITAL_BASED_OUTPATIENT_CLINIC_OR_DEPARTMENT_OTHER)
Admission: RE | Admit: 2014-12-18 | Discharge: 2014-12-18 | Disposition: A | Discharge status: Home / Self Care | Payer: 59 | Source: Ambulatory Visit | Attending: Orthopedic Surgery | Admitting: Orthopedic Surgery

## 2014-12-18 ENCOUNTER — Encounter (HOSPITAL_BASED_OUTPATIENT_CLINIC_OR_DEPARTMENT_OTHER): Payer: Self-pay

## 2014-12-18 ENCOUNTER — Encounter (HOSPITAL_BASED_OUTPATIENT_CLINIC_OR_DEPARTMENT_OTHER)
Admission: RE | Disposition: A | Discharge status: Home / Self Care | Payer: Self-pay | Source: Ambulatory Visit | Attending: Orthopedic Surgery

## 2014-12-18 DIAGNOSIS — X58XXXA Exposure to other specified factors, initial encounter: Secondary | ICD-10-CM | POA: Diagnosis not present

## 2014-12-18 DIAGNOSIS — F17210 Nicotine dependence, cigarettes, uncomplicated: Secondary | ICD-10-CM | POA: Insufficient documentation

## 2014-12-18 DIAGNOSIS — S96891A Other specified injury of other specified muscles and tendons at ankle and foot level, right foot, initial encounter: Secondary | ICD-10-CM | POA: Insufficient documentation

## 2014-12-18 DIAGNOSIS — Y998 Other external cause status: Secondary | ICD-10-CM | POA: Insufficient documentation

## 2014-12-18 DIAGNOSIS — M25374 Other instability, right foot: Secondary | ICD-10-CM | POA: Diagnosis present

## 2014-12-18 DIAGNOSIS — Y9389 Activity, other specified: Secondary | ICD-10-CM | POA: Insufficient documentation

## 2014-12-18 DIAGNOSIS — K589 Irritable bowel syndrome without diarrhea: Secondary | ICD-10-CM | POA: Diagnosis not present

## 2014-12-18 DIAGNOSIS — Y9289 Other specified places as the place of occurrence of the external cause: Secondary | ICD-10-CM | POA: Diagnosis not present

## 2014-12-18 HISTORY — DX: Irritable bowel syndrome, unspecified: K58.9

## 2014-12-18 HISTORY — PX: LIGAMENT REPAIR: SHX5444

## 2014-12-18 HISTORY — DX: Attention-deficit hyperactivity disorder, unspecified type: F90.9

## 2014-12-18 HISTORY — DX: Family history of other specified conditions: Z84.89

## 2014-12-18 HISTORY — DX: Unspecified injury of right foot, initial encounter: S99.921A

## 2014-12-18 SURGERY — REPAIR, LIGAMENT
Anesthesia: General | Site: Foot | Laterality: Right

## 2014-12-18 MED ORDER — LIDOCAINE HCL (CARDIAC) 20 MG/ML IV SOLN
INTRAVENOUS | Status: AC
  Filled 2014-12-18: qty 5

## 2014-12-18 MED ORDER — PROPOFOL 10 MG/ML IV BOLUS
INTRAVENOUS | Status: DC | PRN
  Administered 2014-12-18: 200 mg via INTRAVENOUS

## 2014-12-18 MED ORDER — DOCUSATE SODIUM 100 MG PO CAPS: 100.0000 mg | ORAL_CAPSULE | Freq: Two times a day (BID) | ORAL | Status: DC

## 2014-12-18 MED ORDER — GLYCOPYRROLATE 0.2 MG/ML IJ SOLN: 0.2000 mg | Freq: Once | INTRAMUSCULAR | Status: DC | PRN

## 2014-12-18 MED ORDER — ASPIRIN EC 325 MG PO TBEC: 325.0000 mg | DELAYED_RELEASE_TABLET | Freq: Every day | ORAL | Status: AC

## 2014-12-18 MED ORDER — ONDANSETRON HCL 4 MG/2ML IJ SOLN
INTRAMUSCULAR | Status: DC | PRN
  Administered 2014-12-18: 4 mg via INTRAVENOUS

## 2014-12-18 MED ORDER — HYDROMORPHONE HCL 1 MG/ML IJ SOLN: 0.2500 mg | INTRAMUSCULAR | Status: DC | PRN

## 2014-12-18 MED ORDER — CEFAZOLIN SODIUM-DEXTROSE 2-3 GM-% IV SOLR
2.0000 g | INTRAVENOUS | Status: AC
  Administered 2014-12-18: 2 g via INTRAVENOUS

## 2014-12-18 MED ORDER — FENTANYL CITRATE (PF) 100 MCG/2ML IJ SOLN
INTRAMUSCULAR | Status: AC
  Filled 2014-12-18: qty 2

## 2014-12-18 MED ORDER — ONDANSETRON HCL 4 MG/2ML IJ SOLN
INTRAMUSCULAR | Status: AC
  Filled 2014-12-18: qty 2

## 2014-12-18 MED ORDER — SODIUM CHLORIDE 0.9 % IV SOLN: INTRAVENOUS | Status: DC

## 2014-12-18 MED ORDER — LACTATED RINGERS IV SOLN
INTRAVENOUS | Status: DC
  Administered 2014-12-18 (×2): via INTRAVENOUS

## 2014-12-18 MED ORDER — CHLORHEXIDINE GLUCONATE 4 % EX LIQD: 60.0000 mL | Freq: Once | CUTANEOUS | Status: DC

## 2014-12-18 MED ORDER — 0.9 % SODIUM CHLORIDE (POUR BTL) OPTIME
TOPICAL | Status: DC | PRN
  Administered 2014-12-18: 200 mL

## 2014-12-18 MED ORDER — EPHEDRINE SULFATE 50 MG/ML IJ SOLN
INTRAMUSCULAR | Status: AC
  Filled 2014-12-18: qty 1

## 2014-12-18 MED ORDER — PROMETHAZINE HCL 25 MG/ML IJ SOLN: 6.2500 mg | INTRAMUSCULAR | Status: DC | PRN

## 2014-12-18 MED ORDER — SENNA 8.6 MG PO TABS: 2.0000 | ORAL_TABLET | Freq: Two times a day (BID) | ORAL | Status: DC

## 2014-12-18 MED ORDER — MIDAZOLAM HCL 2 MG/2ML IJ SOLN
1.0000 mg | INTRAMUSCULAR | Status: DC | PRN
  Administered 2014-12-18 (×2): 2 mg via INTRAVENOUS

## 2014-12-18 MED ORDER — BUPIVACAINE-EPINEPHRINE (PF) 0.5% -1:200000 IJ SOLN
INTRAMUSCULAR | Status: DC | PRN
  Administered 2014-12-18: 30 mL via PERINEURAL

## 2014-12-18 MED ORDER — FENTANYL CITRATE (PF) 100 MCG/2ML IJ SOLN
50.0000 ug | INTRAMUSCULAR | Status: DC | PRN
  Administered 2014-12-18: 100 ug via INTRAVENOUS

## 2014-12-18 MED ORDER — DEXAMETHASONE SODIUM PHOSPHATE 10 MG/ML IJ SOLN
INTRAMUSCULAR | Status: AC
  Filled 2014-12-18: qty 1

## 2014-12-18 MED ORDER — MIDAZOLAM HCL 2 MG/2ML IJ SOLN
INTRAMUSCULAR | Status: AC
  Filled 2014-12-18: qty 2

## 2014-12-18 MED ORDER — SCOPOLAMINE 1 MG/3DAYS TD PT72: 1.0000 | MEDICATED_PATCH | Freq: Once | TRANSDERMAL | Status: DC | PRN

## 2014-12-18 MED ORDER — DEXAMETHASONE SODIUM PHOSPHATE 10 MG/ML IJ SOLN
INTRAMUSCULAR | Status: DC | PRN
  Administered 2014-12-18: 10 mg via INTRAVENOUS

## 2014-12-18 MED ORDER — PROPOFOL 500 MG/50ML IV EMUL
INTRAVENOUS | Status: AC
  Filled 2014-12-18: qty 50

## 2014-12-18 MED ORDER — CEFAZOLIN SODIUM-DEXTROSE 2-3 GM-% IV SOLR
INTRAVENOUS | Status: AC
  Filled 2014-12-18: qty 50

## 2014-12-18 MED ORDER — OXYCODONE HCL 5 MG PO TABS: 5.0000 mg | ORAL_TABLET | ORAL | Status: DC | PRN

## 2014-12-18 SURGICAL SUPPLY — 72 items
ANCHOR FT CORKSCREW MICRO 2-0 (Anchor) ×6 IMPLANT
BANDAGE ESMARK 6X9 LF (GAUZE/BANDAGES/DRESSINGS) ×1 IMPLANT
BLADE SURG 15 STRL LF DISP TIS (BLADE) ×3 IMPLANT
BLADE SURG 15 STRL SS (BLADE) ×6
BNDG COHESIVE 4X5 TAN STRL (GAUZE/BANDAGES/DRESSINGS) ×3 IMPLANT
BNDG COHESIVE 6X5 TAN STRL LF (GAUZE/BANDAGES/DRESSINGS) ×3 IMPLANT
BNDG ESMARK 4X9 LF (GAUZE/BANDAGES/DRESSINGS) IMPLANT
BNDG ESMARK 6X9 LF (GAUZE/BANDAGES/DRESSINGS) ×3
CHLORAPREP W/TINT 26ML (MISCELLANEOUS) ×3 IMPLANT
CORDS BIPOLAR (ELECTRODE) ×3 IMPLANT
COVER BACK TABLE 60X90IN (DRAPES) ×3 IMPLANT
CUFF TOURNIQUET SINGLE 24IN (TOURNIQUET CUFF) ×3 IMPLANT
CUFF TOURNIQUET SINGLE 34IN LL (TOURNIQUET CUFF) IMPLANT
DECANTER SPIKE VIAL GLASS SM (MISCELLANEOUS) IMPLANT
DRAPE EXTREMITY T 121X128X90 (DRAPE) ×3 IMPLANT
DRAPE OEC MINIVIEW 54X84 (DRAPES) ×3 IMPLANT
DRAPE U-SHAPE 47X51 STRL (DRAPES) ×3 IMPLANT
DRSG MEPITEL 4X7.2 (GAUZE/BANDAGES/DRESSINGS) ×3 IMPLANT
DRSG PAD ABDOMINAL 8X10 ST (GAUZE/BANDAGES/DRESSINGS) ×6 IMPLANT
ELECT REM PT RETURN 9FT ADLT (ELECTROSURGICAL)
ELECTRODE REM PT RTRN 9FT ADLT (ELECTROSURGICAL) IMPLANT
GAUZE SPONGE 4X4 12PLY STRL (GAUZE/BANDAGES/DRESSINGS) ×3 IMPLANT
GLOVE BIO SURGEON STRL SZ8 (GLOVE) ×3 IMPLANT
GLOVE BIOGEL PI IND STRL 7.0 (GLOVE) ×1 IMPLANT
GLOVE BIOGEL PI IND STRL 8 (GLOVE) ×2 IMPLANT
GLOVE BIOGEL PI INDICATOR 7.0 (GLOVE) ×2
GLOVE BIOGEL PI INDICATOR 8 (GLOVE) ×4
GLOVE ECLIPSE 6.5 STRL STRAW (GLOVE) ×3 IMPLANT
GLOVE ECLIPSE 7.5 STRL STRAW (GLOVE) ×3 IMPLANT
GLOVE EXAM NITRILE MD LF STRL (GLOVE) ×3 IMPLANT
GOWN STRL REUS W/ TWL LRG LVL3 (GOWN DISPOSABLE) ×1 IMPLANT
GOWN STRL REUS W/ TWL XL LVL3 (GOWN DISPOSABLE) ×2 IMPLANT
GOWN STRL REUS W/TWL LRG LVL3 (GOWN DISPOSABLE) ×2
GOWN STRL REUS W/TWL XL LVL3 (GOWN DISPOSABLE) ×4
K-WIRE .062X4 (WIRE) IMPLANT
NDL SUT 6 .5 CRC .975X.05 MAYO (NEEDLE) IMPLANT
NEEDLE HYPO 22GX1.5 SAFETY (NEEDLE) IMPLANT
NEEDLE MAYO TAPER (NEEDLE)
NS IRRIG 1000ML POUR BTL (IV SOLUTION) ×3 IMPLANT
PACK BASIN DAY SURGERY FS (CUSTOM PROCEDURE TRAY) ×3 IMPLANT
PAD CAST 4YDX4 CTTN HI CHSV (CAST SUPPLIES) ×1 IMPLANT
PADDING CAST ABS 4INX4YD NS (CAST SUPPLIES)
PADDING CAST ABS COTTON 4X4 ST (CAST SUPPLIES) IMPLANT
PADDING CAST COTTON 4X4 STRL (CAST SUPPLIES) ×2
PADDING CAST COTTON 6X4 STRL (CAST SUPPLIES) ×3 IMPLANT
PASSER SUT SWANSON 36MM LOOP (INSTRUMENTS) IMPLANT
PENCIL BUTTON HOLSTER BLD 10FT (ELECTRODE) IMPLANT
SANITIZER HAND PURELL 535ML FO (MISCELLANEOUS) ×3 IMPLANT
SHEET MEDIUM DRAPE 40X70 STRL (DRAPES) ×3 IMPLANT
SLEEVE SCD COMPRESS KNEE MED (MISCELLANEOUS) ×3 IMPLANT
SPLINT FAST PLASTER 5X30 (CAST SUPPLIES) ×40
SPLINT PLASTER CAST FAST 5X30 (CAST SUPPLIES) ×20 IMPLANT
SPONGE LAP 18X18 X RAY DECT (DISPOSABLE) IMPLANT
STOCKINETTE 6  STRL (DRAPES) ×2
STOCKINETTE 6 STRL (DRAPES) ×1 IMPLANT
SUCTION FRAZIER TIP 10 FR DISP (SUCTIONS) ×3 IMPLANT
SUT ETHIBOND 0 MO6 C/R (SUTURE) IMPLANT
SUT ETHIBOND 2 OS 4 DA (SUTURE) IMPLANT
SUT ETHILON 3 0 PS 1 (SUTURE) ×3 IMPLANT
SUT FIBERWIRE 2-0 18 17.9 3/8 (SUTURE)
SUT MERSILENE 2.0 SH NDLE (SUTURE) IMPLANT
SUT MNCRL AB 3-0 PS2 18 (SUTURE) ×3 IMPLANT
SUT MNCRL AB 4-0 PS2 18 (SUTURE) IMPLANT
SUT VIC AB 0 SH 27 (SUTURE) IMPLANT
SUT VIC AB 2-0 SH 27 (SUTURE) ×2
SUT VIC AB 2-0 SH 27XBRD (SUTURE) ×1 IMPLANT
SUTURE FIBERWR 2-0 18 17.9 3/8 (SUTURE) IMPLANT
SYR BULB 3OZ (MISCELLANEOUS) ×3 IMPLANT
TOWEL OR 17X24 6PK STRL BLUE (TOWEL DISPOSABLE) ×6 IMPLANT
TUBE CONNECTING 20'X1/4 (TUBING) ×1
TUBE CONNECTING 20X1/4 (TUBING) ×2 IMPLANT
UNDERPAD 30X30 (UNDERPADS AND DIAPERS) ×3 IMPLANT

## 2014-12-18 NOTE — Discharge Instructions (Addendum)
Samantha Hewitt, MD °[facility] ° °Please read the following information regarding your care after surgery. ° °Medications  °You only need a prescription for the narcotic pain medicine (ex. oxycodone, Percocet, Norco).  All of the other medicines listed below are available over the counter. °X acetominophen (Tylenol) 650 mg every 4-6 hours as you need for minor pain °X oxycodone as prescribed for moderate to severe pain °?  ° °Narcotic pain medicine (ex. oxycodone, Percocet, Vicodin) will cause constipation.  To prevent this problem, take the following medicines while you are taking any pain medicine. °X docusate sodium (Colace) 100 mg twice a day X senna (Senokot) 2 tablets twice a day ° °X To help prevent blood clots, take an aspirin (325 mg) once a day for a month after surgery.  You should also get up every hour while you are awake to move around.   ° °Weight Bearing °X Do not bear any weight on the operated leg or foot. ° °Cast / Splint / Dressing °X Keep your splint or cast clean and dry.  Don’t put anything (coat hanger, pencil, etc) down inside of it.  If it gets damp, use a hair dryer on the cool setting to dry it.  If it gets soaked, call the office to schedule an appointment for a cast change. ° ° °After your dressing, cast or splint is removed; you may shower, but do not soak or scrub the wound.  Allow the water to run over it, and then gently pat it dry. ° °Swelling °It is normal for you to have swelling where you had surgery.  To reduce swelling and pain, keep your toes above your nose for at least 3 days after surgery.  It may be necessary to keep your foot or leg elevated for several weeks.  If it hurts, it should be elevated. ° °Follow Up °Call my office at [phone] when you are discharged from the hospital or surgery center to schedule an appointment to be seen two weeks after surgery. ° °Call my office at [phone] if you develop a fever >101.5° F, nausea, vomiting, bleeding from  the surgical site or severe pain.   ° °Regional Anesthesia Blocks ° °1. Numbness or the inability to move the "blocked" extremity may last from 3-48 hours after placement. The length of time depends on the medication injected and your individual response to the medication. If the numbness is not going away after 48 hours, call your surgeon. ° °2. The extremity that is blocked will need to be protected until the numbness is gone and the  Strength has returned. Because you cannot feel it, you will need to take extra care to avoid injury. Because it may be weak, you may have difficulty moving it or using it. You may not know what position it is in without looking at it while the block is in effect. ° °3. For blocks in the legs and feet, returning to weight bearing and walking needs to be done carefully. You will need to wait until the numbness is entirely gone and the strength has returned. You should be able to move your leg and foot normally before you try and bear weight or walk. You will need someone to be with you when you first try to ensure you do not fall and possibly risk injury. ° °4. Bruising and tenderness at the needle site are common side effects and will resolve in a few days. ° °5. Persistent numbness or new problems with movement   should be communicated to the surgeon or the [facility] ([phone])/ [facility] ([phone]). ° °Post Anesthesia Home Care Instructions ° °Activity: °Get plenty of rest for the remainder of the day. A responsible adult should stay with you for 24 hours following the procedure.  °For the next 24 hours, DO NOT: °-Drive a car °-Operate machinery °-Drink alcoholic beverages °-Take any medication unless instructed by your physician °-Make any legal decisions or sign important papers. ° °Meals: °Start with liquid foods such as gelatin or soup. Progress to regular foods as tolerated. Avoid greasy, spicy, heavy foods. If nausea and/or vomiting occur,  drink only clear liquids until the nausea and/or vomiting subsides. Call your physician if vomiting continues. ° °Special Instructions/Symptoms: °Your throat may feel dry or sore from the anesthesia or the breathing tube placed in your throat during surgery. If this causes discomfort, gargle with warm salt water. The discomfort should disappear within 24 hours. ° °If you had a scopolamine patch placed behind your ear for the management of post- operative nausea and/or vomiting: ° °1. The medication in the patch is effective for 72 hours, after which it should be removed.  Wrap patch in a tissue and discard in the trash. Wash hands thoroughly with soap and water. °2. You may remove the patch earlier than 72 hours if you experience unpleasant side effects which may include dry mouth, dizziness or visual disturbances. °3. Avoid touching the patch. Wash your hands with soap and water after contact with the patch. °  ° °

## 2014-12-18 NOTE — Transfer of Care (Signed)
Immediate Anesthesia Transfer of Care Note  Patient: Samantha Medina  Procedure(s) Performed: Procedure(s): RIGHT HALLUX PLANTAR PLATE RECONSTRUCTION (Right)  Patient Location: PACU  Anesthesia Type:GA combined with regional for post-op pain  Level of Consciousness: awake and patient cooperative  Airway & Oxygen Therapy: Patient Spontanous Breathing and Patient connected to face mask oxygen  Post-op Assessment: Report given to RN and Post -op Vital signs reviewed and stable  Post vital signs: Reviewed and stable  Last Vitals:  Filed Vitals:   12/18/14 0845 12/18/14 1107  BP: 117/62 117/76  Pulse: 99 74  Temp:  36.9 C  Resp: 15 20    Complications: No apparent anesthesia complications

## 2014-12-18 NOTE — H&P (Signed)
Samantha Medina is an 24 y.o. female.   Chief Complaint: right forefoot pain HPI: 24 y/o female with h/o right forefoot pain for over a year.  She has signs and sxs c/w plantar plate injury and hallux MPJ instability.  She has failed non op treatment with orthotics, activity modification and shoewear modification.  She presents now for operative treatment.  Past Medical History  Diagnosis Date  . Injury of plantar plate of right foot 11/2014  . Irritable bowel syndrome (IBS)     no current med.  . Family history of adverse reaction to anesthesia     pt's father has hx. of anesthesia awareness during surgery  . ADHD (attention deficit hyperactivity disorder)     Past Surgical History  Procedure Laterality Date  . Colonoscopy with propofol  12/09/2011  . Esophagogastroduodenoscopy (egd) with propofol  12/09/2011  . Knee arthroscopy Bilateral     Family History  Problem Relation Age of Onset  . Hypertension Father   . Anesthesia problems Father     awareness:  states father can hear things during surgery (happened twice)  . Asthma Brother   . Hypothyroidism Mother    Social History:  reports that she has been smoking Cigarettes.  She has smoked for the past 1 year. She has never used smokeless tobacco. She reports that she drinks alcohol. She reports that she does not use illicit drugs.  Allergies: No Known Allergies  Medications Prior to Admission  Medication Sig Dispense Refill  . amphetamine-dextroamphetamine (ADDERALL XR) 30 MG 24 hr capsule Take 1 capsule (30 mg total) by mouth every morning. 30 capsule 0  . etonogestrel-ethinyl estradiol (NUVARING) 0.12-0.015 MG/24HR vaginal ring INSERT VAGINALLY AND LEAVE IN PLACE FOR 3 WEEKS THEN REMOVE FOR 1 WEEK 3 each 1  . naproxen sodium (ANAPROX) 220 MG tablet Take 220 mg by mouth 2 (two) times daily with a meal.      No results found for this or any previous visit (from the past 48 hour(s)). No results found.  ROS  No recent  f/c/n/v/wt loss.  Blood pressure 117/62, pulse 99, temperature 98.1 F (36.7 C), temperature source Oral, resp. rate 15, height 5\' 3"  (1.6 m), weight 63.05 kg (139 lb), SpO2 100 %. Physical Exam  wn wd female in nad.  A and O x 4.  Mood and affect normal. EOMi.  Resp unlabored.  R foot with instability at hallux MPJ.  SKin heatlhy and intact.  Pulses palpable.  No lymphadenopathy.  5/5 strength in PF and DF o f the ankel and toe.  Assessment/Plan Right hallux MPJ instability.  To OR for plantar plate repair.  The risks and benefits of the alternative treatment options have been discussed in detail.  The patient wishes to proceed with surgery and specifically understands risks of bleeding, infection, nerve damage, blood clots, need for additional surgery, amputation and death.   Toni ArthursHEWITT, Samantha Medina 12/18/2014, 9:27 AM

## 2014-12-18 NOTE — Brief Op Note (Signed)
12/18/2014  11:15 AM  PATIENT:  Deretha EmoryAmber B Harrelson  24 y.o. 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  RIGHT HALLUX PLANTAR PLATE RECONSTRUCTION 2.  Right foot AP and lateral xrays  SURGEON:  Toni ArthursJohn Laticha Ferrucci, MD  ASSISTANT: Alfredo MartinezJustin Ollis, PA-C  ANESTHESIA:   General, regional  EBL:  minimal   TOURNIQUET:   Total Tourniquet Time Documented: Thigh (Right) - 60 minutes Total: Thigh (Right) - 60 minutes  COMPLICATIONS:  None apparent  DISPOSITION:  Extubated, awake and stable to recovery.  DICTATION ID:  [id number]

## 2014-12-18 NOTE — Anesthesia Postprocedure Evaluation (Signed)
Anesthesia Post Note  Patient: Samantha Medina  Procedure(s) Performed: Procedure(s) (LRB): RIGHT HALLUX PLANTAR PLATE RECONSTRUCTION (Right)  Patient location during evaluation: PACU Anesthesia Type: General Level of consciousness: sedated Pain management: pain level controlled Vital Signs Assessment: post-procedure vital signs reviewed and stable Respiratory status: spontaneous breathing and respiratory function stable Cardiovascular status: stable Anesthetic complications: no    Last Vitals:  Filed Vitals:   12/18/14 1130 12/18/14 1131  BP:  116/77  Pulse: 83 80  Temp:    Resp: 13 13    Last Pain:  Filed Vitals:   12/18/14 1135  PainSc: 0-No pain    LLE Motor Response: No movement to painful stimulus LLE Sensation: Other (Comment) (UTA pt asleep)          Odelle Kosier DANIEL

## 2014-12-18 NOTE — Pre-Procedure Instructions (Signed)
Assisted Dr. Singer with right, ultrasound guided, popliteal/saphenous block. Side rails up, monitors on throughout procedure. See vital signs in flow sheet. Tolerated Procedure well. 

## 2014-12-18 NOTE — Anesthesia Preprocedure Evaluation (Addendum)
Anesthesia Evaluation  Patient identified by MRN, date of birth, ID band Patient awake    Reviewed: Allergy & Precautions, H&P , NPO status , Patient's Chart, lab work & pertinent test results  Airway Mallampati: II  TM Distance: >3 FB Neck ROM: full    Dental  (+) Teeth Intact, Dental Advidsory Given   Pulmonary Current Smoker,    breath sounds clear to auscultation       Cardiovascular negative cardio ROS   Rhythm:regular Rate:Normal     Neuro/Psych negative neurological ROS  negative psych ROS   GI/Hepatic negative GI ROS, Neg liver ROS,   Endo/Other  negative endocrine ROS  Renal/GU negative Renal ROS     Musculoskeletal   Abdominal   Peds  Hematology   Anesthesia Other Findings   Reproductive/Obstetrics negative OB ROS                            Anesthesia Physical Anesthesia Plan  ASA: II  Anesthesia Plan: General LMA   Post-op Pain Management: GA combined w/ Regional for post-op pain   Induction:   Airway Management Planned:   Additional Equipment:   Intra-op Plan:   Post-operative Plan:   Informed Consent: I have reviewed the patients History and Physical, chart, labs and discussed the procedure including the risks, benefits and alternatives for the proposed anesthesia with the patient or authorized representative who has indicated his/her understanding and acceptance.   Dental Advisory Given  Plan Discussed with: Anesthesiologist, CRNA and Surgeon  Anesthesia Plan Comments:         Anesthesia Quick Evaluation

## 2014-12-18 NOTE — Anesthesia Procedure Notes (Addendum)
Anesthesia Regional Block:  Popliteal block  Pre-Anesthetic Checklist: ,, timeout performed, Correct Patient, Correct Site, Correct Laterality, Correct Procedure, Correct Position, site marked, Risks and benefits discussed,  Surgical consent,  Pre-op evaluation,  At surgeon's request and post-op pain management  Laterality: Right  Prep: chloraprep       Needles:  Injection technique: Single-shot  Needle Type: Echogenic Stimulator Needle          Additional Needles:  Procedures: ultrasound guided (picture in chart) and nerve stimulator Popliteal block  Nerve Stimulator or Paresthesia:  Response: plantar flexion, 0.45 mA,   Additional Responses:   Narrative:  Start time: 12/18/2014 8:27 AM End time: 12/18/2014 8:37 AM Injection made incrementally with aspirations every 5 mL.  Performed by: Personally  Anesthesiologist: Heather RobertsSINGER, JAMES  Additional Notes: A functioning IV was confirmed and monitors were applied.  Sterile prep and drape, hand hygiene and sterile gloves were used.  Negative aspiration and test dose prior to incremental administration of local anesthetic. The patient tolerated the procedure well.Ultrasound  guidance: relevant anatomy identified, needle position confirmed, local anesthetic spread visualized around nerve(s), vascular puncture avoided.  Image printed for medical record.    Procedure Name: LMA Insertion Date/Time: 12/18/2014 9:44 AM Performed by: Darin Arndt D Pre-anesthesia Checklist: Patient identified, Emergency Drugs available, Suction available and Patient being monitored Patient Re-evaluated:Patient Re-evaluated prior to inductionOxygen Delivery Method: Circle System Utilized Preoxygenation: Pre-oxygenation with 100% oxygen Intubation Type: IV induction Ventilation: Mask ventilation without difficulty LMA: LMA inserted LMA Size: 4.0 Number of attempts: 1 Airway Equipment and Method: Bite block Placement Confirmation: positive  ETCO2 Tube secured with: Tape Dental Injury: Teeth and Oropharynx as per pre-operative assessment

## 2014-12-19 ENCOUNTER — Encounter (HOSPITAL_BASED_OUTPATIENT_CLINIC_OR_DEPARTMENT_OTHER): Payer: Self-pay | Admitting: Orthopedic Surgery

## 2014-12-19 NOTE — Op Note (Signed)
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  PROCEDURES: 1. Right hallux plantar plate reconstruction. 2. Right foot AP and lateral radiographs.  SURGEON:  Toni ArthursJohn Ashlinn Hemrick, MD  ASSISTANT:  Alfredo MartinezJustin Ollis, PA-C.  ANESTHESIA:  General, regional.  ESTIMATED BLOOD LOSS:  Minimal.  TOURNIQUET TIME:  60 minutes at 250 mmHg.  COMPLICATIONS:  None apparent.  DISPOSITION:  Extubated, awake, and stable to recovery.  INDICATIONS FOR PROCEDURE:  The patient is a 24 year old female without significant past medical history.  She has a history of right forefoot pain for over a year.  She has failed nonoperative treatment to date including activity modification, oral anti-inflammatories, custom orthotics and shoe wear modifications.  She has signs and symptoms consistent with instability at the MTP joint and a plantar plate tear. She presents now for operative treatment of this painful condition.  She understands the risks and benefits, the alternative treatment options and elects surgical treatment.  She specifically understands risks of bleeding, infection, nerve damage, blood clots, need for additional surgery, continued pain, recurrence of her instability, amputation and death.  PROCEDURE IN DETAIL:  After preoperative consent was obtained and the correct operative site was identified, the patient was brought to the operating room and placed supine on the operating table.  General anesthesia was induced.  Preoperative antibiotics  were administered. Surgical time-out was taken.  The right lower extremity was prepped and draped in standard sterile fashion with tourniquet around the thigh. The extremity was exsanguinated and tourniquet was inflated to 250 mmHg. A longitudinal incision was then made at the plantar aspect of the forefoot along the glabrous border adjacent to the MTP joint.  Sharp dissection was carried down through the skin and subcutaneous tissue. Care was taken to protect the plantar medial neurovascular bundle.  An arthrotomy was then made.  The plantar plate was inspected carefully. There was apparent avulsion of the plantar plate from its insertion at the base of the proximal phalanx.  There was significant synovitis evident in this area.  This was all excised with a rongeur.  Bony surface was roughened with a curette.  Attention was then turned to the plantar surface of the foot where a longitudinal incision was made between the first and second metatarsal head.  Sharp dissection was carried down through the skin and subcutaneous tissue.  Care was taken to protect the plantar lateral neurovascular bundle of the hallux.  The MTP joint was identified and an arthrotomy made in this area.  The base of the proximal phalanx was exposed.  An Arthrex mini corkscrew anchor was inserted and was noted to have appropriate purchase.  A second anchor was inserted at the plantar medial  surface of the proximal phalanx.  The sutures were then passed into the plantar plate.  The toe was plantarflexed at the level of the MP joint and held in this position while the sutures were tied advancing the plantar plate back onto the plantar surface of the bone.  The toe was then noted to be positioned in an appropriate neutral position. Instability had been corrected as compared to preop.  Wound was irrigated.  The medial arthrotomy was closed with simple sutures of 2-0 Vicryl.  Skin incisions were closed with running  sutures of 3-0 nylon. Sterile dressings were applied followed by a well-padded short-leg splint with a spica extension around the hallux.  Tourniquet was released at 60 minutes after application of the dressings.  The patient was awakened from anesthesia and transported to the recovery room in stable condition.  FOLLOWUP PLAN:  The patient will be nonweightbearing on the right lower extremity.  She will follow up with me in the office in 2 weeks for suture removal and fabrication of a splint as well as to begin weightbearing in a CAM boot.  Alfredo Martinez, PA-C, was present and scrubbed for the duration of the case.  His assistance was essential in gaining and maintaining exposure, performing the operation, closing and dressing the wounds and applying the splint.  RADIOGRAPHS:  AP and lateral radiographs of the right hallux show interval placement of too many Arthrex suture anchors at the base of the proximal phalanx.  No evidence of intra-articular placement of the anchors is noted.  No acute injuries are noted.     Toni Arthurs, MD     JH/MEDQ  D:  12/18/2014  T:  12/19/2014  Job:  [id number]

## 2015-01-23 ENCOUNTER — Telehealth: Payer: Self-pay | Admitting: Family Medicine

## 2015-01-23 MED ORDER — AMPHETAMINE-DEXTROAMPHET ER 30 MG PO CP24: 30.0000 mg | ORAL_CAPSULE | ORAL | Status: DC

## 2015-01-23 NOTE — Telephone Encounter (Signed)
rx ready for pick up and patient is aware  

## 2015-01-23 NOTE — Telephone Encounter (Signed)
° ° ° ° ° °  Pt request refill of the following: ° °amphetamine-dextroamphetamine (ADDERALL XR) 30 MG 24 hr capsule ° ° °Phamacy: °

## 2015-01-26 ENCOUNTER — Ambulatory Visit: Payer: 59 | Admitting: Family Medicine

## 2015-02-04 ENCOUNTER — Telehealth: Payer: Self-pay | Admitting: Family Medicine

## 2015-02-04 NOTE — Telephone Encounter (Signed)
Rx was denied.  Patient will need an office visit.  Note sent to pharmacy.

## 2015-02-04 NOTE — Telephone Encounter (Signed)
Patient is requesting a RX refill etonogestrel-ethinyl estradiol (NUVARING) 0.12-0.015 MG/24HR vaginal ring  [facility]: [facility]

## 2015-02-05 ENCOUNTER — Telehealth: Payer: Self-pay | Admitting: Family Medicine

## 2015-02-05 NOTE — Telephone Encounter (Signed)
Patient would like to switch PCP from Dr. Tawanna Cooler to [facility] because of Dr. Nelida Meuse limited schedule now.  Please advise

## 2015-02-05 NOTE — Telephone Encounter (Signed)
That is fine with me.

## 2015-02-09 ENCOUNTER — Other Ambulatory Visit (HOSPITAL_COMMUNITY)
Admission: RE | Admit: 2015-02-09 | Discharge: 2015-02-09 | Disposition: A | Discharge status: Home / Self Care | Payer: 59 | Source: Ambulatory Visit | Attending: Family Medicine | Admitting: Family Medicine

## 2015-02-09 ENCOUNTER — Ambulatory Visit (INDEPENDENT_AMBULATORY_CARE_PROVIDER_SITE_OTHER): Payer: 59 | Admitting: Family Medicine

## 2015-02-09 ENCOUNTER — Encounter: Payer: Self-pay | Admitting: Family Medicine

## 2015-02-09 VITALS — BP 120/80 | Temp 98.2°F | Wt 146.0 lb

## 2015-02-09 DIAGNOSIS — R61 Generalized hyperhidrosis: Secondary | ICD-10-CM | POA: Diagnosis not present

## 2015-02-09 DIAGNOSIS — Z01419 Encounter for gynecological examination (general) (routine) without abnormal findings: Secondary | ICD-10-CM

## 2015-02-09 DIAGNOSIS — Z01411 Encounter for gynecological examination (general) (routine) with abnormal findings: Secondary | ICD-10-CM | POA: Insufficient documentation

## 2015-02-09 DIAGNOSIS — Z1151 Encounter for screening for human papillomavirus (HPV): Secondary | ICD-10-CM | POA: Diagnosis not present

## 2015-02-09 LAB — POCT URINALYSIS DIPSTICK
Bilirubin, UA: NEGATIVE
Glucose, UA: NEGATIVE
Leukocytes, UA: NEGATIVE
NITRITE UA: NEGATIVE
PH UA: 5.5
PROTEIN UA: NEGATIVE
Spec Grav, UA: 1.02
UROBILINOGEN UA: 0.2

## 2015-02-09 LAB — CBC WITH DIFFERENTIAL/PLATELET
BASOS ABS: 0 10*3/uL (ref 0.0–0.1)
Basophils Relative: 0.2 % (ref 0.0–3.0)
EOS ABS: 0.1 10*3/uL (ref 0.0–0.7)
Eosinophils Relative: 1.2 % (ref 0.0–5.0)
HCT: 39.3 % (ref 36.0–46.0)
Hemoglobin: 13.2 g/dL (ref 12.0–15.0)
LYMPHS ABS: 3.7 10*3/uL (ref 0.7–4.0)
Lymphocytes Relative: 34.3 % (ref 12.0–46.0)
MCHC: 33.5 g/dL (ref 30.0–36.0)
MCV: 91.7 fl (ref 78.0–100.0)
Monocytes Absolute: 0.5 10*3/uL (ref 0.1–1.0)
Monocytes Relative: 4.4 % (ref 3.0–12.0)
NEUTROS ABS: 6.5 10*3/uL (ref 1.4–7.7)
NEUTROS PCT: 59.9 % (ref 43.0–77.0)
PLATELETS: 276 10*3/uL (ref 150.0–400.0)
RBC: 4.28 Mil/uL (ref 3.87–5.11)
RDW: 12.5 % (ref 11.5–15.5)
WBC: 10.9 10*3/uL — ABNORMAL HIGH (ref 4.0–10.5)

## 2015-02-09 LAB — HEPATIC FUNCTION PANEL
ALBUMIN: 4.2 g/dL (ref 3.5–5.2)
ALK PHOS: 58 U/L (ref 39–117)
ALT: 10 U/L (ref 0–35)
AST: 16 U/L (ref 0–37)
BILIRUBIN TOTAL: 0.3 mg/dL (ref 0.2–1.2)
Bilirubin, Direct: 0 mg/dL (ref 0.0–0.3)
Total Protein: 7.1 g/dL (ref 6.0–8.3)

## 2015-02-09 LAB — BASIC METABOLIC PANEL
BUN: 10 mg/dL (ref 6–23)
CALCIUM: 9.2 mg/dL (ref 8.4–10.5)
CO2: 25 mEq/L (ref 19–32)
Chloride: 106 mEq/L (ref 96–112)
Creatinine, Ser: 0.79 mg/dL (ref 0.40–1.20)
GFR: 93.59 mL/min (ref >60.00)
GLUCOSE: 89 mg/dL (ref 70–99)
Potassium: 4.2 mEq/L (ref 3.5–5.1)
SODIUM: 141 meq/L (ref 135–145)

## 2015-02-09 LAB — TSH: TSH: 1.38 u[IU]/mL (ref 0.35–4.50)

## 2015-02-09 MED ORDER — ETONOGESTREL-ETHINYL ESTRADIOL 0.12-0.015 MG/24HR VA RING: VAGINAL_RING | VAGINAL | Status: DC

## 2015-02-09 NOTE — Patient Instructions (Signed)
Labs today we will call you the reports ASAP

## 2015-02-09 NOTE — Telephone Encounter (Signed)
Okay with Dr Jametta Moorehead 

## 2015-02-09 NOTE — Progress Notes (Signed)
Pre visit review using our clinic review tool, if applicable. No additional management support is needed unless otherwise documented below in the visit note. 

## 2015-02-09 NOTE — Telephone Encounter (Signed)
Pt is aware and would like to see dr todd for acute issue . Pt is aware cory next transfer opening in march 2017

## 2015-02-09 NOTE — Progress Notes (Signed)
   Subjective:    Patient ID: Samantha Medina, female    DOB: 12/08/1990, 25 y.o.   MRN: [id number]  HPI Samantha Medina is a recently married 25 year old female G0 P0 who comes in today for evaluation of night sweats for the past 3 weeks  She states that about 3 weeks ago she did and waking up at night soaking wet and hot. She uses a NuvaRing 4 out of 5 weeks. She had a normal period about 2 weeks ago that lasted about 4-6 days it was very light.  She otherwise has felt well. Specifically no fever weight loss earache sore throat cough nausea vomiting diarrhea change in bowel habits etc. etc. etc.  She has a history of abnormal Paps. She had a Pap smear in over a year.   Review of Systems    review of systems otherwise negative Objective:   Physical Exam  Well-developed well-nourished female no acute distress vital signs stable she's afebrile HEENT were negative neck was supple thyroid not enlarged cardiopulmonary exam normal abdominal exam normal extremities normal  Bilateral breast exam normal except for a cystic lesion right breast at 9:00. It's small soft rubbery and tender  Pelvic examination external genitalia within normal limits vaginal vault was normal NuvaRing was present. Pap smear was done cervix was very friable and bled. Bimanual exam negative      Assessment & Plan:  Night sweats etiology unknown..........Marland Kitchen begin workup with labs  History of cervical abnormality repeat Pap today........Marland Kitchen report pending

## 2015-02-10 LAB — CYTOLOGY - PAP

## 2015-02-18 ENCOUNTER — Ambulatory Visit: Payer: 59 | Admitting: Family Medicine

## 2015-07-22 ENCOUNTER — Telehealth: Payer: Self-pay | Admitting: Family Medicine

## 2015-07-22 NOTE — Telephone Encounter (Signed)
Patient Name: Samantha Medina Gender: Female DOB: 10/11/1990 Age: 25 Y 3 M 27 D Return Phone Number: [phone] (Primary) Address: City/State/Zip: [place] Client [facility] - Client Client Site [facility] Day Physician Alonza Smokerodd, Jeff - MD Contact Type Call Who Is Calling Patient / Member / Family / Caregiver Call Type Triage / Clinical Relationship To Patient Self Return Phone Number [phone] (Primary) Chief Complaint Health information question (non symptomatic) Reason for Call Symptomatic / Request for Health Information Initial Comment Caller has been on birth control for the last 10 years and does not have insurance. Wants to know if there is anything like the nuvaring that is cheaper. Appointment Disposition EMR Appointment Not Necessary Info pasted into Epic No Translation No Nurse Assessment Nurse: Odis LusterBowers, RN, Bjorn Loserhonda Date/Time (Eastern Time): 07/22/2015 3:21:52 PM Confirm and document reason for call. If symptomatic, describe symptoms. You must click the next button to save text entered. ---Caller has been on birth control for the last 10 years and does not have insurance. Wants to know if there is anything like the nuvaring that is cheaper. This is what she has been using. This is about $160 out of pocket. She is not interested in the Depo Shot. She has very debilititating periods. Reports that her next yearly exam should not be until next Jan. Reports that Dr. Tawanna Coolerodd is currently on sabbatical. Has the patient traveled out of the country within the last 30 days? ---Not Applicable Does the patient have any new or worsening symptoms? ---No Please document clinical information provided and list any resource used. ---Caller reports that she has spoken to the MD office who referred her to the pharmacist for pricing and different options. Also advised her to call her local health dept and [facility] in [facility]  county. Caller voiced understanding and denied any new symptoms needing triage. Guidelines Guideline Title Affirmed Question Affirmed Notes Nurse Date/Time (Eastern Time) Disp. Time Lamount Cohen(Eastern Time) Disposition Final User 07/22/2015 3:09:34 PM Attempt made - message left Kerrin ChampagneBowers, RN, Rhonda 07/22/2015 3:29:37 PM Clinical Call Yes Odis LusterBowers, RN, Bjorn Loserhonda PLEASE NOTE: All timestamps contained within this report are represented as [place] Standard Time. CONFIDENTIALTY NOTICE: This fax transmission is intended only for the addressee. It contains information that is legally privileged, confidential or otherwise protected from use or disclosure. If you are not the intended recipient, you are strictly prohibited from reviewing, disclosing, copying using or disseminating any of this information or taking any action in reliance on or regarding this information. If you have received this fax in error, please notify us immediately by telephone so that we can arrange for its return to us. Phone: [phone], Toll-Free: [phone], Fax: [phone] Page: 2 of 2 Call Id: [id number] Comments User: Marlyce Hugehonda, Bowers, RN Date/Time Lamount Cohen(Eastern Time): 07/22/2015 3:32:36 PM

## 2016-02-22 ENCOUNTER — Ambulatory Visit: Payer: 59

## 2016-02-23 ENCOUNTER — Encounter: Payer: Self-pay | Admitting: General Practice

## 2016-02-23 ENCOUNTER — Telehealth: Payer: Self-pay | Admitting: Family Medicine

## 2016-02-23 NOTE — Telephone Encounter (Signed)
Pt states [facility] will do for $20  the pregnancy test. Pt states she really appreciates our effort.

## 2016-02-23 NOTE — Telephone Encounter (Signed)
Pt has had 5 positive home pregnancy test. Pt has no insurance and needs a positive result from a dr's office to officially note she is pregnant, then she can be eligible for medicaid. Pt could then see an OBGYN with her medicaid.   Pt would like to know if Dr Tawanna Coolerodd will ok for her to come in and just go to lab for a pregnancy test, since she has to pay out of pocket.  Advised pt we would be happy to bill her for an office visit, but wants to know if Dr Tawanna Coolertodd will allow the test without an office visit.  Pt anxious to get this done if OK.

## 2016-03-01 ENCOUNTER — Inpatient Hospital Stay (HOSPITAL_COMMUNITY)
Admission: AD | Admit: 2016-03-01 | Discharge: 2016-03-01 | Disposition: A | Discharge status: Home / Self Care | Payer: Medicaid Other | Source: Ambulatory Visit | Attending: Obstetrics and Gynecology | Admitting: Obstetrics and Gynecology

## 2016-03-01 ENCOUNTER — Encounter (HOSPITAL_COMMUNITY): Payer: Self-pay

## 2016-03-01 DIAGNOSIS — Z87891 Personal history of nicotine dependence: Secondary | ICD-10-CM | POA: Diagnosis not present

## 2016-03-01 DIAGNOSIS — O99611 Diseases of the digestive system complicating pregnancy, first trimester: Secondary | ICD-10-CM | POA: Diagnosis not present

## 2016-03-01 DIAGNOSIS — Z3A11 11 weeks gestation of pregnancy: Secondary | ICD-10-CM | POA: Diagnosis not present

## 2016-03-01 DIAGNOSIS — O210 Mild hyperemesis gravidarum: Secondary | ICD-10-CM | POA: Diagnosis present

## 2016-03-01 DIAGNOSIS — A084 Viral intestinal infection, unspecified: Secondary | ICD-10-CM | POA: Insufficient documentation

## 2016-03-01 LAB — URINALYSIS, ROUTINE W REFLEX MICROSCOPIC
BILIRUBIN URINE: NEGATIVE
GLUCOSE, UA: NEGATIVE mg/dL
Hgb urine dipstick: NEGATIVE
KETONES UR: NEGATIVE mg/dL
Nitrite: NEGATIVE
PH: 5 (ref 5.0–8.0)
PROTEIN: 30 mg/dL — AB
Specific Gravity, Urine: 1.029 (ref 1.005–1.030)

## 2016-03-01 LAB — INFLUENZA PANEL BY PCR (TYPE A & B)
INFLAPCR: NEGATIVE
Influenza B By PCR: NEGATIVE

## 2016-03-01 LAB — POCT PREGNANCY, URINE: Preg Test, Ur: POSITIVE — AB

## 2016-03-01 MED ORDER — LACTATED RINGERS IV BOLUS (SEPSIS)
1000.0000 mL | Freq: Once | INTRAVENOUS | Status: AC
  Administered 2016-03-01: 1000 mL via INTRAVENOUS

## 2016-03-01 MED ORDER — PROMETHAZINE HCL 25 MG/ML IJ SOLN
12.5000 mg | Freq: Four times a day (QID) | INTRAMUSCULAR | Status: DC | PRN
  Administered 2016-03-01: 12.5 mg via INTRAVENOUS
  Filled 2016-03-01: qty 1

## 2016-03-01 MED ORDER — PROMETHAZINE HCL 12.5 MG PO TABS: 12.5000 mg | ORAL_TABLET | Freq: Four times a day (QID) | ORAL | 0 refills | Status: AC | PRN

## 2016-03-01 NOTE — MAU Note (Signed)
Pt feeling better. Will try to eat graham crackers and drink sprite.

## 2016-03-01 NOTE — MAU Provider Note (Signed)
History     CSN: [id number]  Arrival date and time: 03/01/16 [id number]   First Provider Initiated Contact with Patient 03/01/16 [phone]      Chief Complaint  Patient presents with  . Emesis During Pregnancy  . Diarrhea   26 year old G1 P0 at 11 weeks and 0 days presents with 24 hours of nausea and vomiting. She has had chills and intermittent times of feeling warm but did not have a fever when she took her temperature yesterday. She reports since onset last night she is only been able to tolerate sips of water and often immediately is on the toilet vomiting. She has minimal abdominal pain mostly in her abdominal muscles from throwing up. Additionally she is having diarrhea. She reports no blood or mucus in her diarrhea. She presented because she was concerned about dehydration. She denies any sick contacts.   Diarrhea   This is a new problem. The current episode started today. The problem occurs 5 to 10 times per day. The problem has been unchanged. Associated symptoms include abdominal pain, chills and vomiting. Pertinent negatives include no arthralgias, fever or myalgias.  Emesis   This is a new problem. The current episode started today. The problem occurs more than 10 times per day. The problem has been unchanged. The emesis has an appearance of bile and stomach contents. There has been no fever. Associated symptoms include abdominal pain, chills and diarrhea. Pertinent negatives include no arthralgias, chest pain, dizziness, fever or myalgias. The treatment provided mild relief.    OB History    Gravida Para Term Preterm AB Living   1             SAB TAB Ectopic Multiple Live Births                  Past Medical History:  Diagnosis Date  . ADHD (attention deficit hyperactivity disorder)   . Family history of adverse reaction to anesthesia    pt's father has hx. of anesthesia awareness during surgery  . Injury of plantar plate of right foot 11/2014  . Irritable bowel syndrome (IBS)     no current med.    Past Surgical History:  Procedure Laterality Date  . COLONOSCOPY WITH PROPOFOL  12/09/2011  . ESOPHAGOGASTRODUODENOSCOPY (EGD) WITH PROPOFOL  12/09/2011  . KNEE ARTHROSCOPY Bilateral   . LIGAMENT REPAIR Right 12/18/2014   Procedure: RIGHT HALLUX PLANTAR PLATE RECONSTRUCTION;  Surgeon: Toni ArthursJohn Hewitt, MD;  Location:  [facility];  Service: Orthopedics;  Laterality: Right;    Family History  Problem Relation Age of Onset  . Hypertension Father   . Anesthesia problems Father     awareness:  states father can hear things during surgery (happened twice)  . Asthma Brother   . Hypothyroidism Mother     Social History  Substance Use Topics  . Smoking status: Former Smoker    Years: 1.00    Types: Cigarettes  . Smokeless tobacco: Never Used     Comment: 1 cig. every few weeks, per pt.  . Alcohol use 0.0 oz/week     Comment: occasionally    Allergies: No Known Allergies  Prescriptions Prior to Admission  Medication Sig Dispense Refill Last Dose  . Prenatal Vit-Fe Fumarate-FA (PRENATAL MULTIVITAMIN) TABS tablet Take 1 tablet by mouth daily at 12 noon.   03/01/2016 at Unknown time  . amphetamine-dextroamphetamine (ADDERALL XR) 30 MG 24 hr capsule Take 1 capsule (30 mg total) by mouth every morning. (Patient not taking: Reported  on 03/01/2016) 30 capsule 0 Not Taking at Unknown time  . aspirin EC 325 MG tablet Take 1 tablet (325 mg total) by mouth daily. (Patient not taking: Reported on 03/01/2016) 42 tablet 0 Not Taking at Unknown time  . etonogestrel-ethinyl estradiol (NUVARING) 0.12-0.015 MG/24HR vaginal ring INSERT VAGINALLY AND LEAVE IN PLACE FOR 3 WEEKS THEN REMOVE FOR 1 WEEK (Patient not taking: Reported on 03/01/2016) 3 each 6 Not Taking at Unknown time    Review of Systems  Constitutional: Positive for chills and fatigue. Negative for fever.  HENT: Negative for congestion and rhinorrhea.   Respiratory: Negative for chest tightness and shortness of  breath.   Cardiovascular: Negative for chest pain and palpitations.  Gastrointestinal: Positive for abdominal pain, diarrhea, nausea and vomiting. Negative for abdominal distention, blood in stool and constipation.  Genitourinary: Negative for difficulty urinating, dysuria and frequency.  Musculoskeletal: Negative for arthralgias, back pain and myalgias.  Neurological: Negative for dizziness and weakness.   Physical Exam   Blood pressure 129/80, pulse 107, temperature 97.7 F (36.5 C), temperature source Oral, resp. rate 18, height 5' 3.78" (1.62 m), weight 145 lb (65.8 kg), last menstrual period 12/15/2015, SpO2 100 %.  Physical Exam  Vitals reviewed. Constitutional: She is oriented to person, place, and time. She appears well-developed and well-nourished.  Cardiovascular: Normal rate, normal heart sounds and intact distal pulses.  Exam reveals no gallop and no friction rub.   No murmur heard. Minimally tachycardic  Respiratory: Effort normal and breath sounds normal. No respiratory distress. She has no wheezes. She has no rales.  GI: Soft. Bowel sounds are normal. She exhibits no distension. There is no tenderness. There is no rebound and no guarding.  Musculoskeletal: Normal range of motion. She exhibits no edema, tenderness or deformity.  Neurological: She is alert and oriented to person, place, and time. No cranial nerve deficit.  Skin: Skin is warm and dry.  Capillary refill less than 2 seconds  Psychiatric: She has a normal mood and affect. Her behavior is normal.    MAU Course  Procedures  MDM In MA U patient underwent following testing and treatment -Patient underwent flu swab which was negative -She received 1 L of lactated Ringer's and 12.5 mg of IV Phenergan following this she felt significantly better and was able to tolerate sips of Sprite and grahm crackers. -She did urinate once while in MA U  Assessment and Plan  #1: Viral gastroenteritis recommended supportive  care continue by mouth hydration take solids as able called Phenergan to patient's pharmacy. Patient okay and stable for discharge.  Ernestina Penna 03/01/2016, 10:40 AM

## 2016-03-01 NOTE — Discharge Instructions (Signed)
Dehydration, Adult Dehydration is when there is not enough fluid or water in your body. This happens when you lose more fluids than you take in. Dehydration can range from mild to very bad. It should be treated right away to keep it from getting very bad. Symptoms of mild dehydration may include:   Thirst.  Dry lips.  Slightly dry mouth.  Dry, warm skin.  Dizziness. Symptoms of moderate dehydration may include:   Very dry mouth.  Muscle cramps.  Dark pee (urine). Pee may be the color of tea.  Your body making less pee.  Your eyes making fewer tears.  Heartbeat that is uneven or faster than normal (palpitations).  Headache.  Light-headedness, especially when you stand up from sitting.  Fainting (syncope). Symptoms of very bad dehydration may include:   Changes in skin, such as:  Cold and clammy skin.  Blotchy (mottled) or pale skin.  Skin that does not quickly return to normal after being lightly pinched and let go (poor skin turgor).  Changes in body fluids, such as:  Feeling very thirsty.  Your eyes making fewer tears.  Not sweating when body temperature is high, such as in hot weather.  Your body making very little pee.  Changes in vital signs, such as:  Weak pulse.  Pulse that is more than 100 beats a minute when you are sitting still.  Fast breathing.  Low blood pressure.  Other changes, such as:  Sunken eyes.  Cold hands and feet.  Confusion.  Lack of energy (lethargy).  Trouble waking up from sleep.  Short-term weight loss.  Unconsciousness. Follow these instructions at home:  If told by your doctor, drink an ORS:  Make an ORS by using instructions on the package.  Start by drinking small amounts, about  cup (120 mL) every 5-10 minutes.  Slowly drink more until you have had the amount that your doctor said to have.  Drink enough clear fluid to keep your pee clear or pale yellow. If you were told to drink an ORS, finish the  ORS first, then start slowly drinking clear fluids. Drink fluids such as:  Water. Do not drink only water by itself. Doing that can make the salt (sodium) level in your body get too low (hyponatremia).  Ice chips.  Fruit juice that you have added water to (diluted).  Low-calorie sports drinks.  Avoid:  Alcohol.  Drinks that have a lot of sugar. These include high-calorie sports drinks, fruit juice that does not have water added, and soda.  Caffeine.  Foods that are greasy or have a lot of fat or sugar.  Take over-the-counter and prescription medicines only as told by your doctor.  Do not take salt tablets. Doing that can make the salt level in your body get too high (hypernatremia).  Eat foods that have minerals (electrolytes). Examples include bananas, oranges, potatoes, tomatoes, and spinach.  Keep all follow-up visits as told by your doctor. This is important. Contact a doctor if:  You have belly (abdominal) pain that:  Gets worse.  Stays in one area (localizes).  You have a rash.  You have a stiff neck.  You get angry or annoyed more easily than normal (irritability).  You are more sleepy than normal.  You have a harder time waking up than normal.  You feel:  Weak.  Dizzy.  Very thirsty.  You have peed (urinated) only a small amount of very dark pee during 6-8 hours. Get help right away if:  You   have symptoms of very bad dehydration.  You cannot drink fluids without throwing up (vomiting).  Your symptoms get worse with treatment.  You have a fever.  You have a very bad headache.  You are throwing up or having watery poop (diarrhea) and it:  Gets worse.  Does not go away.  You have blood or something green (bile) in your throw-up.  You have blood in your poop (stool). This may cause poop to look black and tarry.  You have not peed in 6-8 hours.  You pass out (faint).  Your heart rate when you are sitting still is more than 100 beats a  minute.  You have trouble breathing. This information is not intended to replace advice given to you by your health care provider. Make sure you discuss any questions you have with your health care provider. Document Released: 10/30/2008 Document Revised: 07/24/2015 Document Reviewed: 02/27/2015 Elsevier Interactive Patient Education  2017 [employer]  

## 2016-03-01 NOTE — MAU Note (Addendum)
Started yesterday at 4p.m. Vomiting and diarrhea. Had it for a day last wk.  Denies fever or bleeding. Several home tests, not confirmed yet

## 2016-03-02 LAB — CULTURE, OB URINE: Culture: <10000 — AB

## 2016-03-22 ENCOUNTER — Encounter: Payer: 59 | Admitting: Obstetrics and Gynecology

## 2016-04-11 ENCOUNTER — Encounter: Payer: 59 | Admitting: Medical

## 2016-04-26 ENCOUNTER — Encounter: Payer: Self-pay | Admitting: Advanced Practice Midwife

## 2016-04-26 ENCOUNTER — Ambulatory Visit (INDEPENDENT_AMBULATORY_CARE_PROVIDER_SITE_OTHER): Payer: Medicaid Other | Admitting: Obstetrics and Gynecology

## 2016-04-26 ENCOUNTER — Other Ambulatory Visit (HOSPITAL_COMMUNITY)
Admission: RE | Admit: 2016-04-26 | Discharge: 2016-04-26 | Disposition: A | Discharge status: Home / Self Care | Payer: Medicaid Other | Source: Ambulatory Visit | Attending: Advanced Practice Midwife | Admitting: Advanced Practice Midwife

## 2016-04-26 VITALS — BP 107/76 | HR 104 | Wt 150.9 lb

## 2016-04-26 DIAGNOSIS — Z3490 Encounter for supervision of normal pregnancy, unspecified, unspecified trimester: Secondary | ICD-10-CM

## 2016-04-26 DIAGNOSIS — Z3492 Encounter for supervision of normal pregnancy, unspecified, second trimester: Secondary | ICD-10-CM | POA: Diagnosis present

## 2016-04-26 DIAGNOSIS — Z3400 Encounter for supervision of normal first pregnancy, unspecified trimester: Secondary | ICD-10-CM | POA: Insufficient documentation

## 2016-04-26 DIAGNOSIS — N871 Moderate cervical dysplasia: Secondary | ICD-10-CM | POA: Diagnosis not present

## 2016-04-26 DIAGNOSIS — Z113 Encounter for screening for infections with a predominantly sexual mode of transmission: Secondary | ICD-10-CM | POA: Diagnosis not present

## 2016-04-26 DIAGNOSIS — Z3A00 Weeks of gestation of pregnancy not specified: Secondary | ICD-10-CM | POA: Insufficient documentation

## 2016-04-26 LAB — POCT URINALYSIS DIP (DEVICE)
Bilirubin Urine: NEGATIVE
Glucose, UA: NEGATIVE mg/dL
HGB URINE DIPSTICK: NEGATIVE
Ketones, ur: NEGATIVE mg/dL
NITRITE: NEGATIVE
Protein, ur: NEGATIVE mg/dL
Specific Gravity, Urine: 1.02 (ref 1.005–1.030)
UROBILINOGEN UA: 0.2 mg/dL (ref 0.0–1.0)
pH: 7 (ref 5.0–8.0)

## 2016-04-26 NOTE — Progress Notes (Signed)
  Subjective:    Samantha Medina is being seen today for her first obstetrical visit.  This is a planned pregnancy. She is at [redacted]w[redacted]d gestation. Her obstetrical history is significant for nothing. Relationship with FOB: spouse, living together. Patient does intend to breast feed. Pregnancy history fully reviewed.  Patient reports no complaints. N/V early pregnancy--resolved. Gaining weight now.   Review of Systems:   Review of Systems  Constitutional: Negative for chills and fever.  Gastrointestinal: Negative for abdominal pain, nausea and vomiting.  Genitourinary: Negative for dysuria, vaginal bleeding and vaginal discharge.    Objective:     BP 107/76   Pulse (!) 104   Wt 150 lb 14.4 oz (68.4 kg)   LMP 12/15/2015   BMI 26.08 kg/m  Physical Exam  Nursing note and vitals reviewed. Constitutional: She is oriented to person, place, and time. She appears well-developed and well-nourished. No distress.  Eyes: Conjunctivae are normal. No scleral icterus.  Cardiovascular: Normal rate, regular rhythm and normal heart sounds.   Respiratory: Effort normal and breath sounds normal. No respiratory distress.  GI: Soft. Bowel sounds are normal. She exhibits no distension. There is no tenderness.  Genitourinary:  Genitourinary Comments: Declined  Neurological: She is alert and oriented to person, place, and time. She has normal reflexes.  Skin: Skin is warm.  Psychiatric: She has a normal mood and affect.    Maternal Exam:  Abdomen: Fundal height is 18 cm.       Fetal Exam Fetal Monitor Review: Mode: hand-held doppler probe.   Baseline rate: 161.         Assessment:    Pregnancy: G1P0 Patient Active Problem List   Diagnosis Date Noted  . Supervision of normal first pregnancy, antepartum 04/26/2016  . Night sweats 02/09/2015  . Tinea versicolor 07/10/2012  . BREAST MASS, LEFT 02/16/2009  . MODERATE DYSPLASIA OF CERVIX 07/09/2007     1. Encounter for supervision of low-risk  pregnancy, antepartum  - Obstetric Panel, Including HIV - POCT urinalysis dip (device) - [place] MFM OB COMP + 14 WK; Future - GC/Chlamydia probe amp ([place])not at [facility] - Culture, OB Urine - Cystic Fibrosis Mutation 97  2. Supervision of normal first pregnancy, antepartum  - Obstetric Panel, Including HIV - POCT urinalysis dip (device) - [place] MFM OB COMP + 14 WK; Future - GC/Chlamydia probe amp ([place])not at [facility] - Culture, OB Urine - Cystic Fibrosis Mutation 97    Plan:     Initial labs drawn. Prenatal vitamins. Problem list reviewed and updated. AFP3 discussed: declined. Role of ultrasound in pregnancy discussed; fetal survey: ordered. Amniocentesis discussed: not indicated. Follow up in 4 weeks. Considering NIPS.   Considering waterbirth  [place] 04/26/2016

## 2016-04-26 NOTE — Progress Notes (Signed)
Patient was given new OB packet. Home medicaid form complete.  Declined flu vaccine. U/S scheduled for May 7th at 12:45pm. Patient notified.

## 2016-04-26 NOTE — Patient Instructions (Signed)
Considering Waterbirth? Guide for patients at [facility]  Why consider waterbirth?  . Gentle birth for babies . Less pain medicine used in labor . May allow for passive descent/less pushing . May reduce perineal tears  . More mobility and instinctive maternal position changes . Increased maternal relaxation . Reduced blood pressure in labor  Is waterbirth safe? What are the risks of infection, drowning or other complications?  . Infection: o Very low risk (3.7 % for tub vs 4.8% for bed) o 7 in 8000 waterbirths with documented infection o Poorly cleaned equipment most common cause o Slightly lower group B strep transmission rate  . Drowning o Maternal:  - Very low risk   - Related to seizures or fainting o Newborn:  - Very low risk. No evidence of increased risk of respiratory problems in multiple large studies - Physiological protection from breathing under water - Avoid underwater birth if there are any fetal complications - Once baby's head is out of the water, keep it out.  . Birth complication o Some reports of cord trauma, but risk decreased by bringing baby to surface gradually o No evidence of increased risk of shoulder dystocia. Mothers can usually change positions faster in water than in a bed, possibly aiding the maneuvers to free the shoulder.   Am I a candidate for waterbirth?  Yes, if you are: . Full-term (37 weeks or greater)  . Have had an uncomplicated pregnancy and labor  No, if you have: Marland Kitchen Preterm birth less than 37 weeks . Thick, particulate meconium stained fluid . Maternal fever over 101 . Heavy bleeding or signs of placental abruption . Pre-eclampsia  . Any abnormal fetal heart rate pattern . Breech presentation . Twins  . Very large baby . Active communicable infection (this does NOT include group B strep) . Significant limitation to mobility  Please remember that birth is unpredictable. Under certain unforeseeable  circumstances your provider may advise against giving birth in the tub. These decisions will be made on a case-by-case basis and with the safety of you and your baby as our highest priority.  Requirements for patients planning waterbirth  . Ask your midwife if you will be a candidate for waterbirth. . Attend the [facility] [place] Education at [phone] or [phone] for dates and times. The class is free and we strongly encourage you to bring your support person. You will receive a certificate of participation to show to your midwife or doctor. . Supplies needed for [facility] and [facility] patients: o Single-use disposable tub liner (birthpoolinabox.com  REGULAR size) o New garden hose labeled "lead-free", "suitable for drinking water", "non-toxic" OR "water potable" o Garden hose to remove the dirty water o Faucet adaptor to attach hose to faucet         o Electric drain pump to remove water (We recommend 792 gallon per hour or greater pump.)  o Fish net o Bathing suit top (optional) o Long-handled mirror (optional)  [employer] sells tubs for $120 if you would rather purchase your own tub   Thinking About Waterbirth???  You must attend a Doren Custard class at [facility] ([place])  3rd Wednesday of every month from 7-9pm  [employer] by calling [phone] or online at [employer]Federal.at  Bring [place] the certificate from the class  Waterbirth supplies needed for [facility] patients:  Our practice has a Heritage manager in a Box tub at the hospital that you can borrow  You will need to purchase an accessory kit that has all needed supplies through [facility] [employer] [phone]) or online $175.00  Or you can purchase the supplies separately: o Single-use disposable tub liner for Birth Pool in a Box (REGULAR size) o New garden hose labeled "lead-free", "suitable for drinking  water", o Electric drain pump to remove water (We recommend 792 gallon per hour or greater pump.)  o  "non-toxic" OR "water potable" o Garden hose to remove the dirty water o Fish net o Bathing suit top (optional) o Long-handled mirror (optional)  https://cherry.com/ sells tubs for ~ $120 if you would rather purchase your own tub.  They also sell accessories, liners.    Www.waterbirthsolutions.com for tub purchases and supplies  The Labor Ladies (www.thelaborladies.com) $275 for tub rental/set-up & take down/kit   [employer] information regarding doulas (labor support) who provide pool rentals:  LatinCafes.be.htm   The Labor Ladies (www.thelaborladies.com)  LatinCafes.be.htm   Things that would prevent you from having a waterbirth:  Premature, <37wks  Previous cesarean birth  Presence of thick meconium-stained fluid  Multiple gestation (Twins, triplets, etc.)  Uncontrolled diabetes or gestational diabetes requiring medication  Hypertension  Heavy vaginal bleeding  Non-reassuring fetal heart rate  Active infection (MRSA, etc.)  If your labor has to be induced and induction method requires continuous monitoring of the baby's heart rate  Other risks/issues identified by your obstetrical provider    Second Trimester of Pregnancy The second trimester is from week 14 through week 27 (months 4 through 6). The second trimester is often a time when you feel your best. Your body has adjusted to being pregnant, and you begin to feel better physically. Usually, morning sickness has lessened or quit completely, you may have more energy, and you may have an increase in appetite. The second trimester is also a time when the fetus is growing rapidly. At the end of the sixth month, the fetus is about 9 inches long and weighs about 1 pounds. You will likely begin to feel the baby move (quickening) between 16 and 20 weeks of  pregnancy. Body changes during your second trimester Your body continues to go through many changes during your second trimester. The changes vary from woman to woman.  Your weight will continue to increase. You will notice your lower abdomen bulging out.  You may begin to get stretch marks on your hips, abdomen, and breasts.  You may develop headaches that can be relieved by medicines. The medicines should be approved by your health care provider.  You may urinate more often because the fetus is pressing on your bladder.  You may develop or continue to have heartburn as a result of your pregnancy.  You may develop constipation because certain hormones are causing the muscles that push waste through your intestines to slow down.  You may develop hemorrhoids or swollen, bulging veins (varicose veins).  You may have back pain. This is caused by:  Weight gain.  Pregnancy hormones that are relaxing the joints in your pelvis.  A shift in weight and the muscles that support your balance.  Your breasts will continue to grow and they will continue to become tender.  Your gums may bleed and may be sensitive to brushing and flossing.  Dark spots or blotches (chloasma, mask of pregnancy) may develop on your face. This will likely fade after the baby is born.  A dark line from your belly button to the pubic area (linea nigra) may appear.  This will likely fade after the baby is born.  You may have changes in your hair. These can include thickening of your hair, rapid growth, and changes in texture. Some women also have hair loss during or after pregnancy, or hair that feels dry or thin. Your hair will most likely return to normal after your baby is born. What to expect at prenatal visits During a routine prenatal visit:  You will be weighed to make sure you and the fetus are growing normally.  Your blood pressure will be taken.  Your abdomen will be measured to track your baby's  growth.  The fetal heartbeat will be listened to.  Any test results from the previous visit will be discussed. Your health care provider may ask you:  How you are feeling.  If you are feeling the baby move.  If you have had any abnormal symptoms, such as leaking fluid, bleeding, severe headaches, or abdominal cramping.  If you are using any tobacco products, including cigarettes, chewing tobacco, and electronic cigarettes.  If you have any questions. Other tests that may be performed during your second trimester include:  Blood tests that check for:  Low iron levels (anemia).  High blood sugar that affects pregnant women (gestational diabetes) between 17 and 28 weeks.  Rh antibodies. This is to check for a protein on red blood cells (Rh factor).  Urine tests to check for infections, diabetes, or protein in the urine.  An ultrasound to confirm the proper growth and development of the baby.  An amniocentesis to check for possible genetic problems.  Fetal screens for spina bifida and Down syndrome.  HIV (human immunodeficiency virus) testing. Routine prenatal testing includes screening for HIV, unless you choose not to have this test. Follow these instructions at home: Medicines   Follow your health care provider's instructions regarding medicine use. Specific medicines may be either safe or unsafe to take during pregnancy.  Take a prenatal vitamin that contains at least 600 micrograms (mcg) of folic acid.  If you develop constipation, try taking a stool softener if your health care provider approves. Eating and drinking   Eat a balanced diet that includes fresh fruits and vegetables, whole grains, good sources of protein such as meat, eggs, or tofu, and low-fat dairy. Your health care provider will help you determine the amount of weight gain that is right for you.  Avoid raw meat and uncooked cheese. These carry germs that can cause birth defects in the baby.  If you  have low calcium intake from food, talk to your health care provider about whether you should take a daily calcium supplement.  Limit foods that are high in fat and processed sugars, such as fried and sweet foods.  To prevent constipation:  Drink enough fluid to keep your urine clear or pale yellow.  Eat foods that are high in fiber, such as fresh fruits and vegetables, whole grains, and beans. Activity   Exercise only as directed by your health care provider. Most women can continue their usual exercise routine during pregnancy. Try to exercise for 30 minutes at least 5 days a week. Stop exercising if you experience uterine contractions.  Avoid heavy lifting, wear low heel shoes, and practice good posture.  A sexual relationship may be continued unless your health care provider directs you otherwise. Relieving pain and discomfort   Wear a good support bra to prevent discomfort from breast tenderness.  Take warm sitz baths to soothe any pain or discomfort caused  by hemorrhoids. Use hemorrhoid cream if your health care provider approves.  Rest with your legs elevated if you have leg cramps or low back pain.  If you develop varicose veins, wear support hose. Elevate your feet for 15 minutes, 3-4 times a day. Limit salt in your diet. Prenatal Care   Write down your questions. Take them to your prenatal visits.  Keep all your prenatal visits as told by your health care provider. This is important. Safety   Wear your seat belt at all times when driving.  Make a list of emergency phone numbers, including numbers for family, friends, the hospital, and police and fire departments. General instructions   Ask your health care provider for a referral to a local prenatal education class. Begin classes no later than the beginning of month 6 of your pregnancy.  Ask for help if you have counseling or nutritional needs during pregnancy. Your health care provider can offer advice or refer you  to specialists for help with various needs.  Do not use hot tubs, steam rooms, or saunas.  Do not douche or use tampons or scented sanitary pads.  Do not cross your legs for long periods of time.  Avoid cat litter boxes and soil used by cats. These carry germs that can cause birth defects in the baby and possibly loss of the fetus by miscarriage or stillbirth.  Avoid all smoking, herbs, alcohol, and unprescribed drugs. Chemicals in these products can affect the formation and growth of the baby.  Do not use any products that contain nicotine or tobacco, such as cigarettes and e-cigarettes. If you need help quitting, ask your health care provider.  Visit your dentist if you have not gone yet during your pregnancy. Use a soft toothbrush to brush your teeth and be gentle when you floss. Contact a health care provider if:  You have dizziness.  You have mild pelvic cramps, pelvic pressure, or nagging pain in the abdominal area.  You have persistent nausea, vomiting, or diarrhea.  You have a bad smelling vaginal discharge.  You have pain when you urinate. Get help right away if:  You have a fever.  You are leaking fluid from your vagina.  You have spotting or bleeding from your vagina.  You have severe abdominal cramping or pain.  You have rapid weight gain or weight loss.  You have shortness of breath with chest pain.  You notice sudden or extreme swelling of your face, hands, ankles, feet, or legs.  You have not felt your baby move in over an hour.  You have severe headaches that do not go away when you take medicine.  You have vision changes. Summary  The second trimester is from week 14 through week 27 (months 4 through 6). It is also a time when the fetus is growing rapidly.  Your body goes through many changes during pregnancy. The changes vary from woman to woman.  Avoid all smoking, herbs, alcohol, and unprescribed drugs. These chemicals affect the formation and  growth your baby.  Do not use any tobacco products, such as cigarettes, chewing tobacco, and e-cigarettes. If you need help quitting, ask your health care provider.  Contact your health care provider if you have any questions. Keep all prenatal visits as told by your health care provider. This is important. This information is not intended to replace advice given to you by your health care provider. Make sure you discuss any questions you have with your health care provider. Document  12/28/2000 Document Revised: 06/11/2015 Document Reviewed: 03/06/2012 Elsevier Interactive Patient Education  2017 Elsevier Inc.  

## 2016-04-26 NOTE — Addendum Note (Signed)
Addended by: Faythe Casa on: 04/26/2016 11:39 AM   Modules accepted: Orders

## 2016-04-27 LAB — OBSTETRIC PANEL, INCLUDING HIV
ANTIBODY SCREEN: NEGATIVE
BASOS: 0 %
Basophils Absolute: 0 10*3/uL (ref 0.0–0.2)
EOS (ABSOLUTE): 0.1 10*3/uL (ref 0.0–0.4)
Eos: 1 %
HEMATOCRIT: 35.7 % (ref 34.0–46.6)
HIV SCREEN 4TH GENERATION: NONREACTIVE
Hemoglobin: 11.9 g/dL (ref 11.1–15.9)
Hepatitis B Surface Ag: NEGATIVE
IMMATURE GRANS (ABS): 0 10*3/uL (ref 0.0–0.1)
Immature Granulocytes: 0 %
LYMPHS ABS: 2 10*3/uL (ref 0.7–3.1)
LYMPHS: 20 %
MCH: 30.9 pg (ref 26.6–33.0)
MCHC: 33.3 g/dL (ref 31.5–35.7)
MCV: 93 fL (ref 79–97)
MONOS ABS: 0.6 10*3/uL (ref 0.1–0.9)
Monocytes: 6 %
NEUTROS ABS: 7.2 10*3/uL — AB (ref 1.4–7.0)
Neutrophils: 73 %
Platelets: 264 10*3/uL (ref 150–379)
RBC: 3.85 x10E6/uL (ref 3.77–5.28)
RDW: 13 % (ref 12.3–15.4)
RPR Ser Ql: NONREACTIVE
Rh Factor: POSITIVE
Rubella Antibodies, IGG: <0.9 index — ABNORMAL LOW (ref >0.99)
WBC: 9.9 10*3/uL (ref 3.4–10.8)

## 2016-04-27 LAB — GC/CHLAMYDIA PROBE AMP (~~LOC~~) NOT AT ARMC
CHLAMYDIA, DNA PROBE: NEGATIVE
Neisseria Gonorrhea: NEGATIVE

## 2016-04-28 ENCOUNTER — Encounter: Payer: Self-pay | Admitting: Advanced Practice Midwife

## 2016-04-28 DIAGNOSIS — Z283 Underimmunization status: Secondary | ICD-10-CM | POA: Insufficient documentation

## 2016-04-28 DIAGNOSIS — O09899 Supervision of other high risk pregnancies, unspecified trimester: Secondary | ICD-10-CM | POA: Insufficient documentation

## 2016-04-28 DIAGNOSIS — O9989 Other specified diseases and conditions complicating pregnancy, childbirth and the puerperium: Secondary | ICD-10-CM

## 2016-04-29 LAB — AFP, QUAD SCREEN
DIA Mom Value: 0.88
DIA VALUE (EIA): 153.33 pg/mL
DSR (By Age)    1 IN: 892
DSR (SECOND TRIMESTER) 1 IN: 9632
Gestational Age: 15.6 WEEKS
MSAFP MOM: 1.04
MSAFP: 32.1 ng/mL
MSHCG MOM: 0.74
MSHCG: 32046 m[IU]/mL
Maternal Age At EDD: 27.5 yr
Osb Risk: 10000
TEST RESULTS AFP: NEGATIVE
WEIGHT: 150 [lb_av]
uE3 Mom: 0.99
uE3 Value: 0.67 ng/mL

## 2016-04-29 LAB — CULTURE, OB URINE

## 2016-04-29 LAB — URINE CULTURE, OB REFLEX

## 2016-05-02 LAB — CYSTIC FIBROSIS MUTATION 97: GENE DIS ANAL CARRIER INTERP BLD/T-IMP: NOT DETECTED

## 2016-05-19 ENCOUNTER — Telehealth: Payer: Self-pay | Admitting: Family Medicine

## 2016-05-19 NOTE — Telephone Encounter (Signed)
Patient called requesting to cancel appointment for follow-up. She stated that she would call us back once she is able to take time off of work. Informed patient that it is very important to keep her follow-up's and to call me back asap to reschedule.

## 2016-05-23 ENCOUNTER — Ambulatory Visit (HOSPITAL_COMMUNITY)
Admission: RE | Admit: 2016-05-23 | Discharge: 2016-05-23 | Disposition: A | Discharge status: Home / Self Care | Payer: Medicaid Other | Source: Ambulatory Visit | Attending: Advanced Practice Midwife | Admitting: Advanced Practice Midwife

## 2016-05-23 DIAGNOSIS — Z3689 Encounter for other specified antenatal screening: Secondary | ICD-10-CM | POA: Insufficient documentation

## 2016-05-23 DIAGNOSIS — Z3A19 19 weeks gestation of pregnancy: Secondary | ICD-10-CM | POA: Diagnosis not present

## 2016-05-23 DIAGNOSIS — Z3490 Encounter for supervision of normal pregnancy, unspecified, unspecified trimester: Secondary | ICD-10-CM | POA: Diagnosis present

## 2016-05-23 DIAGNOSIS — Z3400 Encounter for supervision of normal first pregnancy, unspecified trimester: Secondary | ICD-10-CM

## 2016-05-31 ENCOUNTER — Encounter: Payer: Medicaid Other | Admitting: Advanced Practice Midwife

## 2016-06-08 ENCOUNTER — Ambulatory Visit (INDEPENDENT_AMBULATORY_CARE_PROVIDER_SITE_OTHER): Payer: Medicaid Other | Admitting: Advanced Practice Midwife

## 2016-06-08 ENCOUNTER — Encounter: Payer: Self-pay | Admitting: Advanced Practice Midwife

## 2016-06-08 ENCOUNTER — Encounter: Payer: Medicaid Other | Admitting: Advanced Practice Midwife

## 2016-06-08 DIAGNOSIS — Z3402 Encounter for supervision of normal first pregnancy, second trimester: Secondary | ICD-10-CM | POA: Diagnosis not present

## 2016-06-08 DIAGNOSIS — Z3400 Encounter for supervision of normal first pregnancy, unspecified trimester: Secondary | ICD-10-CM

## 2016-06-08 NOTE — Patient Instructions (Signed)

## 2016-06-08 NOTE — Progress Notes (Signed)
   PRENATAL VISIT NOTE  Subjective:  Samantha Medina is a 26 y.o. G1P0 at 6665w5d being seen today for ongoing prenatal care.  She is currently monitored for the following issues for this low-risk pregnancy and has BREAST MASS, LEFT; Moderate dysplasia of cervix; Tinea versicolor; Night sweats; Supervision of normal first pregnancy, antepartum; and Rubella non-immune status, antepartum on her problem list.  Patient reports no complaints.  Contractions: Not present. Vag. Bleeding: None.  Movement: Present. Denies leaking of fluid.   The following portions of the patient's history were reviewed and updated as appropriate: allergies, current medications, past family history, past medical history, past social history, past surgical history and problem list. Problem list updated.  Objective:   Vitals:   06/08/16 1349  BP: 122/63  Pulse: 86  Weight: 160 lb 11.2 oz (72.9 kg)    Fetal Status: Fetal Heart Rate (bpm): 145   Movement: Present     General:  Alert, oriented and cooperative. Patient is in no acute distress.  Skin: Skin is warm and dry. No rash noted.   Cardiovascular: Normal heart rate noted  Respiratory: Normal respiratory effort, no problems with respiration noted  Abdomen: Soft, gravid, appropriate for gestational age. Pain/Pressure: Absent     Pelvic:  Cervical exam deferred        Extremities: Normal range of motion.  Edema: None  Mental Status: Normal mood and affect. Normal behavior. Normal judgment and thought content.   Assessment and Plan:  Pregnancy: G1P0 at 3065w5d  1. Supervision of normal first pregnancy, antepartum  [facility]  Discussed she is welcome to return if she gets risked out later on.  Lives here but wants that setting. If comes back may want waterbirth.  Understands requirements   Preterm labor symptoms and general obstetric precautions including but not limited to vaginal bleeding, contractions, leaking of fluid and  fetal movement were reviewed in detail with the patient. Please refer to After Visit Summary for other counseling recommendations.  RTO PRN  Wynelle BourgeoisMarie Katheren Jimmerson, CNM

## 2016-07-06 ENCOUNTER — Encounter: Payer: Self-pay | Admitting: *Deleted

## 2017-01-06 ENCOUNTER — Encounter (HOSPITAL_COMMUNITY): Payer: Self-pay

## 2019-10-18 DIAGNOSIS — Z419 Encounter for procedure for purposes other than remedying health state, unspecified: Secondary | ICD-10-CM | POA: Diagnosis not present

## 2019-11-08 DIAGNOSIS — Z3481 Encounter for supervision of other normal pregnancy, first trimester: Secondary | ICD-10-CM | POA: Diagnosis not present

## 2019-11-18 DIAGNOSIS — Z419 Encounter for procedure for purposes other than remedying health state, unspecified: Secondary | ICD-10-CM | POA: Diagnosis not present

## 2019-12-18 DIAGNOSIS — Z419 Encounter for procedure for purposes other than remedying health state, unspecified: Secondary | ICD-10-CM | POA: Diagnosis not present

## 2020-01-09 DIAGNOSIS — Z36 Encounter for antenatal screening for chromosomal anomalies: Secondary | ICD-10-CM | POA: Diagnosis not present

## 2020-01-09 DIAGNOSIS — Z3482 Encounter for supervision of other normal pregnancy, second trimester: Secondary | ICD-10-CM | POA: Diagnosis not present

## 2020-01-09 DIAGNOSIS — Z363 Encounter for antenatal screening for malformations: Secondary | ICD-10-CM | POA: Diagnosis not present

## 2020-01-09 DIAGNOSIS — N911 Secondary amenorrhea: Secondary | ICD-10-CM | POA: Diagnosis not present

## 2020-01-09 DIAGNOSIS — R5383 Other fatigue: Secondary | ICD-10-CM | POA: Diagnosis not present

## 2020-01-18 DIAGNOSIS — Z419 Encounter for procedure for purposes other than remedying health state, unspecified: Secondary | ICD-10-CM | POA: Diagnosis not present

## 2020-01-31 DIAGNOSIS — M9902 Segmental and somatic dysfunction of thoracic region: Secondary | ICD-10-CM | POA: Diagnosis not present

## 2020-01-31 DIAGNOSIS — M9905 Segmental and somatic dysfunction of pelvic region: Secondary | ICD-10-CM | POA: Diagnosis not present

## 2020-01-31 DIAGNOSIS — M955 Acquired deformity of pelvis: Secondary | ICD-10-CM | POA: Diagnosis not present

## 2020-01-31 DIAGNOSIS — M62830 Muscle spasm of back: Secondary | ICD-10-CM | POA: Diagnosis not present

## 2020-01-31 DIAGNOSIS — M5416 Radiculopathy, lumbar region: Secondary | ICD-10-CM | POA: Diagnosis not present

## 2020-01-31 DIAGNOSIS — M9903 Segmental and somatic dysfunction of lumbar region: Secondary | ICD-10-CM | POA: Diagnosis not present

## 2020-02-05 DIAGNOSIS — M9902 Segmental and somatic dysfunction of thoracic region: Secondary | ICD-10-CM | POA: Diagnosis not present

## 2020-02-05 DIAGNOSIS — M5416 Radiculopathy, lumbar region: Secondary | ICD-10-CM | POA: Diagnosis not present

## 2020-02-05 DIAGNOSIS — M62830 Muscle spasm of back: Secondary | ICD-10-CM | POA: Diagnosis not present

## 2020-02-05 DIAGNOSIS — M9903 Segmental and somatic dysfunction of lumbar region: Secondary | ICD-10-CM | POA: Diagnosis not present

## 2020-02-05 DIAGNOSIS — M955 Acquired deformity of pelvis: Secondary | ICD-10-CM | POA: Diagnosis not present

## 2020-02-05 DIAGNOSIS — M9905 Segmental and somatic dysfunction of pelvic region: Secondary | ICD-10-CM | POA: Diagnosis not present

## 2020-02-06 DIAGNOSIS — M5416 Radiculopathy, lumbar region: Secondary | ICD-10-CM | POA: Diagnosis not present

## 2020-02-06 DIAGNOSIS — M955 Acquired deformity of pelvis: Secondary | ICD-10-CM | POA: Diagnosis not present

## 2020-02-06 DIAGNOSIS — M9903 Segmental and somatic dysfunction of lumbar region: Secondary | ICD-10-CM | POA: Diagnosis not present

## 2020-02-06 DIAGNOSIS — M9902 Segmental and somatic dysfunction of thoracic region: Secondary | ICD-10-CM | POA: Diagnosis not present

## 2020-02-06 DIAGNOSIS — M9905 Segmental and somatic dysfunction of pelvic region: Secondary | ICD-10-CM | POA: Diagnosis not present

## 2020-02-06 DIAGNOSIS — M62830 Muscle spasm of back: Secondary | ICD-10-CM | POA: Diagnosis not present

## 2020-02-11 DIAGNOSIS — M955 Acquired deformity of pelvis: Secondary | ICD-10-CM | POA: Diagnosis not present

## 2020-02-11 DIAGNOSIS — M9903 Segmental and somatic dysfunction of lumbar region: Secondary | ICD-10-CM | POA: Diagnosis not present

## 2020-02-11 DIAGNOSIS — M9902 Segmental and somatic dysfunction of thoracic region: Secondary | ICD-10-CM | POA: Diagnosis not present

## 2020-02-11 DIAGNOSIS — M9905 Segmental and somatic dysfunction of pelvic region: Secondary | ICD-10-CM | POA: Diagnosis not present

## 2020-02-11 DIAGNOSIS — M62830 Muscle spasm of back: Secondary | ICD-10-CM | POA: Diagnosis not present

## 2020-02-11 DIAGNOSIS — M5416 Radiculopathy, lumbar region: Secondary | ICD-10-CM | POA: Diagnosis not present

## 2020-02-12 DIAGNOSIS — M9902 Segmental and somatic dysfunction of thoracic region: Secondary | ICD-10-CM | POA: Diagnosis not present

## 2020-02-12 DIAGNOSIS — M5416 Radiculopathy, lumbar region: Secondary | ICD-10-CM | POA: Diagnosis not present

## 2020-02-12 DIAGNOSIS — M9903 Segmental and somatic dysfunction of lumbar region: Secondary | ICD-10-CM | POA: Diagnosis not present

## 2020-02-12 DIAGNOSIS — M9905 Segmental and somatic dysfunction of pelvic region: Secondary | ICD-10-CM | POA: Diagnosis not present

## 2020-02-12 DIAGNOSIS — M955 Acquired deformity of pelvis: Secondary | ICD-10-CM | POA: Diagnosis not present

## 2020-02-12 DIAGNOSIS — M62830 Muscle spasm of back: Secondary | ICD-10-CM | POA: Diagnosis not present

## 2020-02-13 DIAGNOSIS — M5416 Radiculopathy, lumbar region: Secondary | ICD-10-CM | POA: Diagnosis not present

## 2020-02-13 DIAGNOSIS — M955 Acquired deformity of pelvis: Secondary | ICD-10-CM | POA: Diagnosis not present

## 2020-02-13 DIAGNOSIS — M9902 Segmental and somatic dysfunction of thoracic region: Secondary | ICD-10-CM | POA: Diagnosis not present

## 2020-02-13 DIAGNOSIS — M9903 Segmental and somatic dysfunction of lumbar region: Secondary | ICD-10-CM | POA: Diagnosis not present

## 2020-02-13 DIAGNOSIS — M62830 Muscle spasm of back: Secondary | ICD-10-CM | POA: Diagnosis not present

## 2020-02-13 DIAGNOSIS — M9905 Segmental and somatic dysfunction of pelvic region: Secondary | ICD-10-CM | POA: Diagnosis not present

## 2020-02-18 DIAGNOSIS — M5416 Radiculopathy, lumbar region: Secondary | ICD-10-CM | POA: Diagnosis not present

## 2020-02-18 DIAGNOSIS — M9903 Segmental and somatic dysfunction of lumbar region: Secondary | ICD-10-CM | POA: Diagnosis not present

## 2020-02-18 DIAGNOSIS — Z419 Encounter for procedure for purposes other than remedying health state, unspecified: Secondary | ICD-10-CM | POA: Diagnosis not present

## 2020-02-18 DIAGNOSIS — M9902 Segmental and somatic dysfunction of thoracic region: Secondary | ICD-10-CM | POA: Diagnosis not present

## 2020-02-18 DIAGNOSIS — M9905 Segmental and somatic dysfunction of pelvic region: Secondary | ICD-10-CM | POA: Diagnosis not present

## 2020-02-18 DIAGNOSIS — M955 Acquired deformity of pelvis: Secondary | ICD-10-CM | POA: Diagnosis not present

## 2020-02-18 DIAGNOSIS — M62830 Muscle spasm of back: Secondary | ICD-10-CM | POA: Diagnosis not present

## 2020-02-20 DIAGNOSIS — M62830 Muscle spasm of back: Secondary | ICD-10-CM | POA: Diagnosis not present

## 2020-02-20 DIAGNOSIS — M9902 Segmental and somatic dysfunction of thoracic region: Secondary | ICD-10-CM | POA: Diagnosis not present

## 2020-02-20 DIAGNOSIS — M9905 Segmental and somatic dysfunction of pelvic region: Secondary | ICD-10-CM | POA: Diagnosis not present

## 2020-02-20 DIAGNOSIS — M5416 Radiculopathy, lumbar region: Secondary | ICD-10-CM | POA: Diagnosis not present

## 2020-02-20 DIAGNOSIS — M9903 Segmental and somatic dysfunction of lumbar region: Secondary | ICD-10-CM | POA: Diagnosis not present

## 2020-02-20 DIAGNOSIS — M955 Acquired deformity of pelvis: Secondary | ICD-10-CM | POA: Diagnosis not present

## 2020-02-25 DIAGNOSIS — M5416 Radiculopathy, lumbar region: Secondary | ICD-10-CM | POA: Diagnosis not present

## 2020-02-25 DIAGNOSIS — M9905 Segmental and somatic dysfunction of pelvic region: Secondary | ICD-10-CM | POA: Diagnosis not present

## 2020-02-25 DIAGNOSIS — M62830 Muscle spasm of back: Secondary | ICD-10-CM | POA: Diagnosis not present

## 2020-02-25 DIAGNOSIS — M955 Acquired deformity of pelvis: Secondary | ICD-10-CM | POA: Diagnosis not present

## 2020-02-25 DIAGNOSIS — M9902 Segmental and somatic dysfunction of thoracic region: Secondary | ICD-10-CM | POA: Diagnosis not present

## 2020-02-25 DIAGNOSIS — M9903 Segmental and somatic dysfunction of lumbar region: Secondary | ICD-10-CM | POA: Diagnosis not present

## 2020-02-27 DIAGNOSIS — M5416 Radiculopathy, lumbar region: Secondary | ICD-10-CM | POA: Diagnosis not present

## 2020-02-27 DIAGNOSIS — M955 Acquired deformity of pelvis: Secondary | ICD-10-CM | POA: Diagnosis not present

## 2020-02-27 DIAGNOSIS — M9903 Segmental and somatic dysfunction of lumbar region: Secondary | ICD-10-CM | POA: Diagnosis not present

## 2020-02-27 DIAGNOSIS — M9905 Segmental and somatic dysfunction of pelvic region: Secondary | ICD-10-CM | POA: Diagnosis not present

## 2020-02-27 DIAGNOSIS — M9902 Segmental and somatic dysfunction of thoracic region: Secondary | ICD-10-CM | POA: Diagnosis not present

## 2020-02-27 DIAGNOSIS — M62830 Muscle spasm of back: Secondary | ICD-10-CM | POA: Diagnosis not present

## 2020-03-03 DIAGNOSIS — M955 Acquired deformity of pelvis: Secondary | ICD-10-CM | POA: Diagnosis not present

## 2020-03-03 DIAGNOSIS — M9902 Segmental and somatic dysfunction of thoracic region: Secondary | ICD-10-CM | POA: Diagnosis not present

## 2020-03-03 DIAGNOSIS — M9905 Segmental and somatic dysfunction of pelvic region: Secondary | ICD-10-CM | POA: Diagnosis not present

## 2020-03-03 DIAGNOSIS — M62830 Muscle spasm of back: Secondary | ICD-10-CM | POA: Diagnosis not present

## 2020-03-03 DIAGNOSIS — M5416 Radiculopathy, lumbar region: Secondary | ICD-10-CM | POA: Diagnosis not present

## 2020-03-03 DIAGNOSIS — M9903 Segmental and somatic dysfunction of lumbar region: Secondary | ICD-10-CM | POA: Diagnosis not present

## 2020-03-05 DIAGNOSIS — M955 Acquired deformity of pelvis: Secondary | ICD-10-CM | POA: Diagnosis not present

## 2020-03-05 DIAGNOSIS — M62830 Muscle spasm of back: Secondary | ICD-10-CM | POA: Diagnosis not present

## 2020-03-05 DIAGNOSIS — M9905 Segmental and somatic dysfunction of pelvic region: Secondary | ICD-10-CM | POA: Diagnosis not present

## 2020-03-05 DIAGNOSIS — M9902 Segmental and somatic dysfunction of thoracic region: Secondary | ICD-10-CM | POA: Diagnosis not present

## 2020-03-05 DIAGNOSIS — M5416 Radiculopathy, lumbar region: Secondary | ICD-10-CM | POA: Diagnosis not present

## 2020-03-05 DIAGNOSIS — M9903 Segmental and somatic dysfunction of lumbar region: Secondary | ICD-10-CM | POA: Diagnosis not present

## 2020-03-11 DIAGNOSIS — M62830 Muscle spasm of back: Secondary | ICD-10-CM | POA: Diagnosis not present

## 2020-03-11 DIAGNOSIS — M5416 Radiculopathy, lumbar region: Secondary | ICD-10-CM | POA: Diagnosis not present

## 2020-03-11 DIAGNOSIS — M9902 Segmental and somatic dysfunction of thoracic region: Secondary | ICD-10-CM | POA: Diagnosis not present

## 2020-03-11 DIAGNOSIS — M955 Acquired deformity of pelvis: Secondary | ICD-10-CM | POA: Diagnosis not present

## 2020-03-11 DIAGNOSIS — M9903 Segmental and somatic dysfunction of lumbar region: Secondary | ICD-10-CM | POA: Diagnosis not present

## 2020-03-11 DIAGNOSIS — M9905 Segmental and somatic dysfunction of pelvic region: Secondary | ICD-10-CM | POA: Diagnosis not present

## 2020-03-17 ENCOUNTER — Ambulatory Visit: Payer: Medicaid Other | Attending: Obstetrics and Gynecology | Admitting: Physical Therapy

## 2020-03-17 ENCOUNTER — Other Ambulatory Visit: Payer: Self-pay

## 2020-03-17 ENCOUNTER — Encounter: Payer: Self-pay | Admitting: Physical Therapy

## 2020-03-17 DIAGNOSIS — Z419 Encounter for procedure for purposes other than remedying health state, unspecified: Secondary | ICD-10-CM | POA: Diagnosis not present

## 2020-03-17 DIAGNOSIS — M6281 Muscle weakness (generalized): Secondary | ICD-10-CM | POA: Diagnosis not present

## 2020-03-17 DIAGNOSIS — R279 Unspecified lack of coordination: Secondary | ICD-10-CM | POA: Insufficient documentation

## 2020-03-17 NOTE — Therapy (Signed)
[facility] [address], [address] [place], [place], [address] Phone: [phone]   Fax:  [phone]  Physical Therapy Evaluation  Patient Details  Name: Samantha Medina MRN: [id number] Date of Birth: Apr 01, 1990 Referring Provider (PT): Alvino Chapel, MD   Encounter Date: 03/17/2020   PT End of Session - 03/17/20 1105    Visit Number 1    Date for PT Re-Evaluation 06/09/20    Authorization Type medicaid prep    PT Start Time 1016    PT Stop Time 1059    PT Time Calculation (min) 43 min    Activity Tolerance Patient tolerated treatment well    Behavior During Therapy [facility] for tasks assessed/performed           Past Medical History:  Diagnosis Date  . ADHD (attention deficit hyperactivity disorder)   . Family history of adverse reaction to anesthesia    pt's father has hx. of anesthesia awareness during surgery  . Injury of plantar plate of right foot 11/2014  . Irritable bowel syndrome (IBS)    no current med.    Past Surgical History:  Procedure Laterality Date  . COLONOSCOPY WITH PROPOFOL  12/09/2011  . ESOPHAGOGASTRODUODENOSCOPY (EGD) WITH PROPOFOL  12/09/2011  . KNEE ARTHROSCOPY Bilateral   . LIGAMENT REPAIR Right 12/18/2014   Procedure: RIGHT HALLUX PLANTAR PLATE RECONSTRUCTION;  Surgeon: Toni Arthurs, MD;  Location: [facility];  Service: Orthopedics;  Laterality: Right;    There were no vitals filed for this visit.    Subjective Assessment - 03/17/20 1025    Subjective Pt is 29 weeks into her pregnancy.  I am peeing more a lot more frequenty and barely any urine comes out.  Pt reprots pressure and pain from vagina to groin and Lt>Rt and into the tailbone.  Sometimes there is pain the buttocks and upper leg but that only happened a few times.    Currently in Pain? Yes    Pain Score 4     Pain Location Vagina    Pain Orientation Right;Left;Medial    Pain Descriptors / Indicators Aching;Sharp     Pain Type Acute pain    Pain Onset More than a month ago    Pain Frequency Intermittent    Aggravating Factors  not sure, in the morning and wakes me at night    Pain Relieving Factors chiro adjustments helps for a couple of days    Multiple Pain Sites No              [facility] PT Assessment - 03/17/20 0001      Assessment   Medical Diagnosis M99.04 (ICD-10-CM) - Segmental and somatic dysfunction of sacral region; M54.50 (ICD-10-CM) - Low back pain, unspecified;N81.84 (ICD-10-CM) - Pelvic muscle wasting    Referring Provider (PT) Alvino Chapel, MD    Onset Date/Surgical Date --   January   Prior Therapy Not for current issue, has had pelvic PT in the past      Precautions   Precautions None    Precaution Comments [redacted] weeks gestation      Balance Screen   Has the patient fallen in the past 6 months No      Home Environment   Living Environment Private residence    Living Arrangements Spouse/significant other;Children      Prior Function   Level of Independence Independent      Cognition   Overall Cognitive Status Within Functional Limits for tasks assessed  Functional Tests   Functional tests Single leg stance      Single Leg Stance   Comments Rt hip externally rotates and pain on Lt side when standing on the Rt      Posture/Postural Control   Posture Comments pregnancy posture      ROM / Strength   AROM / PROM / Strength AROM;PROM;Strength      Strength   Overall Strength Comments hip abduction pain and 4/5; adduction 4/5 no pain; core weakness with hard time lifting Lt LE      Flexibility   Soft Tissue Assessment /Muscle Length yes    Hamstrings 80%      Palpation   Palpation comment Lt glutes and SI joint TTP      Transfers   Comments increased pain when rolling side to side in bed      Ambulation/Gait   Gait Pattern Decreased stride length                      Objective measurements completed on examination: See above findings.      Pelvic Floor Special Questions - 03/17/20 0001    Prior Pelvic/Prostate Exam Yes    Are you Pregnant or attempting pregnancy? Yes    Prior Pregnancies Yes    Number of Pregnancies 1    Number of Vaginal Deliveries 1    Currently Sexually Active No    Marinoff Scale pain prevents any attempts at intercourse    Urinary Leakage Yes    How often occasional    Pad use no    Activities that cause leaking Coughing;Sneezing    Urinary frequency yes more than previous pregnancy and only very small amount released    Fecal incontinence No    Falling out feeling (prolapse) No    Pelvic Floor Internal Exam pt identity confirmed and informed consent given    Exam Type Vaginal    Palpation TTP and high tone levators Lt>Rt not relaxing all the way between reps    Strength fair squeeze, definite lift    Strength # of reps 1   8 quick   Strength # of seconds 5    Tone high posterior more tight than anterior            [facility] Adult PT Treatment/Exercise - 03/17/20 0001      Self-Care   Self-Care Other Self-Care Comments    Other Self-Care Comments  toileting and self stretch                  PT Education - 03/17/20 1738    Education Details toileting and self stretch    Person(s) Educated Patient    Methods Explanation;Demonstration;Handout    Comprehension Verbalized understanding;Returned demonstration            PT Short Term Goals - 03/17/20 1102      PT SHORT TERM GOAL #1   Title ind with toileting and self massage    Time 4    Period Weeks    Status New    Target Date 04/14/20             PT Long Term Goals - 03/17/20 1032      PT LONG TERM GOAL #1   Title Pt will report at least 50% less pain overall    Time 12    Period Weeks    Status New    Target Date 06/09/20      PT  LONG TERM GOAL #2   Title Pt will report easier time having a BM with at least 50% less strain    Baseline straining more 2/3 times    Time 12    Period Weeks    Status New     Target Date 06/09/20      PT LONG TERM GOAL #3   Title Pt will only have to wake up 2-3x at the most    Baseline 5-6x due to pain    Time 12    Target Date 06/09/20      PT LONG TERM GOAL #4   Title Pt will report pain of 4/10 at most with lifting her daughter and walking    Baseline 8/10    Time 12    Period Weeks    Status New    Target Date 06/09/20      PT LONG TERM GOAL #5   Title ind with HEP for continued healthy pregnancy with ability to exercise without increased pain    Baseline does not know    Time 12    Period Weeks    Status New    Target Date 06/09/20                  Plan - 03/17/20 1058    Clinical Impression Statement Pt presents to skilled PT at [redacted] weeks gestation due to pelvic pain that has been worsening during this pregnnacy.  Pt has hip and core weakness and reduced pelvic floor endurance with 5 seconds hold x 1 rep.  Pt holds increased tone in pelvic floor and TTP throughout with more tone Lt side causing reduced pelvic floor muscle activity with 3/5 MMT.  Pt has had more difficulty with BMs and high tone in posterior pelvic floor and core weakness may be a contributer of this.  She has tight and tender adductors on the Lt side.  Pt has posture deficits and SLS with compensatory movements when standing on the Rt side.  Pt will benefit from skilled PT to address impairments and restore function for healthy pregnancy.    Personal Factors and Comorbidities Past/Current Experience    Examination-Activity Limitations Caring for Others;Carry;Continence;Toileting;Stand;Sleep    Examination-Participation Restrictions Interpersonal Relationship    Stability/Clinical Decision Making Evolving/Moderate complexity    Clinical Decision Making Moderate    Rehab Potential Excellent    PT Frequency 2x / week    PT Duration 12 weeks    PT Treatment/Interventions ADLs/Self Care Home Management;Biofeedback;Cryotherapy;Electrical Stimulation;Moist Heat;Therapeutic  activities;Therapeutic exercise;Neuromuscular re-education;Manual techniques;Patient/family education;Taping    PT Next Visit Plan internal STM to levators and coccys mobs, basic core strength    PT Home Exercise Plan toileting and discussed pelvic stretches    Consulted and Agree with Plan of Care Patient           Patient will benefit from skilled therapeutic intervention in order to improve the following deficits and impairments:  Pain,Postural dysfunction,Impaired flexibility,Increased fascial restricitons,Decreased strength,Decreased coordination  Visit Diagnosis: Unspecified lack of coordination  Muscle weakness (generalized)     Problem List Patient Active Problem List   Diagnosis Date Noted  . Rubella non-immune status, antepartum 04/28/2016  . Supervision of normal first pregnancy, antepartum 04/26/2016  . Night sweats 02/09/2015  . Tinea versicolor 07/10/2012  . BREAST MASS, LEFT 02/16/2009  . Moderate dysplasia of cervix 07/09/2007    Junious Silk, PT 03/17/2020, 5:47 PM  [facility] [address], [address] [place],  [place], [id number] Phone: [phone]   Fax:  [phone]  Name: Samantha Medina MRN: [id number] Date of Birth: 10/28/1990

## 2020-03-17 NOTE — Patient Instructions (Addendum)
Toileting Techniques for Bowel Movements (Defecation) Using your belly (abdomen) and pelvic floor muscles to have a bowel movement is usually instinctive.  Sometimes people can have problems with these muscles and have to relearn proper defecation (emptying) techniques.  If you have weakness in your muscles, organs that are falling out, decreased sensation in your pelvis, or ignore your urge to go, you may find yourself straining to have a bowel movement.  You are straining if you are: . holding your breath or taking in a huge gulp of air and holding it  . keeping your lips and jaw tensed and closed tightly . turning red in the face because of excessive pushing or forcing . developing or worsening your  hemorrhoids . getting faint while pushing . not emptying completely and have to defecate many times a day  If you are straining, you are actually making it harder for yourself to have a bowel movement.  Many people find they are pulling up with the pelvic floor muscles and closing off instead of opening the anus. Due to lack pelvic floor relaxation and coordination the abdominal muscles, one has to work harder to push the feces out.  Many people have never been taught how to defecate efficiently and effectively.  Notice what happens to your body when you are having a bowel movement.  While you are sitting on the toilet pay attention to the following areas: . Jaw and mouth position . Angle of your hips   . Whether your feet touch the ground or not . Arm placement  . Spine position . Waist . Belly tension . Anus (opening of the anal canal)  An Evacuation/Defecation Plan   Here are the 4 basic points:  1. Lean forward enough for your elbows to rest on your knees 2. Support your feet on the floor or use a low stool if your feet don't touch the floor  3. Push out your belly as if you have swallowed a beach ball-you should feel a widening of your waist 4. Open and relax your pelvic floor muscles,  rather than tightening around the anus      The following conditions my require modifications to your toileting posture:  . If you have had surgery in the past that limits your back, hip, pelvic, knee or ankle flexibility . Constipation   Your healthcare practitioner may make the following additional suggestions and adjustments:  1) Sit on the toilet  a) Make sure your feet are supported. b) Notice your hip angle and spine position-most people find it effective to lean forward or raise their knees, which can help the muscles around the anus to relax  c) When you lean forward, place your forearms on your thighs for support  2) Relax suggestions a) Breath deeply in through your nose and out slowly through your mouth as if you are smelling the flowers and blowing out the candles. b) To become aware of how to relax your muscles, contracting and releasing muscles can be helpful.  Pull your pelvic floor muscles in tightly by using the image of holding back gas, or closing around the anus (visualize making a circle smaller) and lifting the anus up and in.  Then release the muscles and your anus should drop down and feel open. Repeat 5 times ending with the feeling of relaxation. c) Keep your pelvic floor muscles relaxed; let your belly bulge out. d) The digestive tract starts at the mouth and ends at the anal opening, so be   sure to relax both ends of the tube.  Place your tongue on the roof of your mouth with your teeth separated.  This helps relax your mouth and will help to relax the anus at the same time.  3) Empty (defecation) a) Keep your pelvic floor and sphincter relaxed, then bulge your anal muscles.  Make the anal opening wide.  b) Stick your belly out as if you have swallowed a beach ball. c) Make your belly wall hard using your belly muscles while continuing to breathe. Doing this makes it easier to open your anus. d) Breath out and give a grunt (or try using other sounds such as  ahhhh, shhhhh, ohhhh or grrrrrrr).  4) Finish a) As you finish your bowel movement, pull the pelvic floor muscles up and in.  This will leave your anus in the proper place rather than remaining pushed out and down. If you leave your anus pushed out and down, it will start to feel as though that is normal and give you incorrect signals about needing to have a bowel movement.   [facility] [address] [place], [place] [address]  

## 2020-03-19 DIAGNOSIS — M5416 Radiculopathy, lumbar region: Secondary | ICD-10-CM | POA: Diagnosis not present

## 2020-03-19 DIAGNOSIS — M9905 Segmental and somatic dysfunction of pelvic region: Secondary | ICD-10-CM | POA: Diagnosis not present

## 2020-03-19 DIAGNOSIS — M955 Acquired deformity of pelvis: Secondary | ICD-10-CM | POA: Diagnosis not present

## 2020-03-19 DIAGNOSIS — M62830 Muscle spasm of back: Secondary | ICD-10-CM | POA: Diagnosis not present

## 2020-03-19 DIAGNOSIS — M9902 Segmental and somatic dysfunction of thoracic region: Secondary | ICD-10-CM | POA: Diagnosis not present

## 2020-03-19 DIAGNOSIS — M9903 Segmental and somatic dysfunction of lumbar region: Secondary | ICD-10-CM | POA: Diagnosis not present

## 2020-03-26 DIAGNOSIS — M955 Acquired deformity of pelvis: Secondary | ICD-10-CM | POA: Diagnosis not present

## 2020-03-26 DIAGNOSIS — M62830 Muscle spasm of back: Secondary | ICD-10-CM | POA: Diagnosis not present

## 2020-03-26 DIAGNOSIS — M9905 Segmental and somatic dysfunction of pelvic region: Secondary | ICD-10-CM | POA: Diagnosis not present

## 2020-03-26 DIAGNOSIS — M9902 Segmental and somatic dysfunction of thoracic region: Secondary | ICD-10-CM | POA: Diagnosis not present

## 2020-03-26 DIAGNOSIS — M5416 Radiculopathy, lumbar region: Secondary | ICD-10-CM | POA: Diagnosis not present

## 2020-03-26 DIAGNOSIS — M9903 Segmental and somatic dysfunction of lumbar region: Secondary | ICD-10-CM | POA: Diagnosis not present

## 2020-03-27 ENCOUNTER — Encounter: Payer: Self-pay | Admitting: Physical Therapy

## 2020-03-27 ENCOUNTER — Ambulatory Visit: Payer: Medicaid Other | Admitting: Physical Therapy

## 2020-03-27 ENCOUNTER — Other Ambulatory Visit: Payer: Self-pay

## 2020-03-27 DIAGNOSIS — M6281 Muscle weakness (generalized): Secondary | ICD-10-CM | POA: Diagnosis not present

## 2020-03-27 DIAGNOSIS — R279 Unspecified lack of coordination: Secondary | ICD-10-CM | POA: Diagnosis not present

## 2020-03-27 NOTE — Therapy (Signed)
[facility] [address], [address] [place], [place], [address] Phone: [phone]   Fax:  [phone]  Physical Therapy Treatment  Patient Details  Name: Samantha Medina MRN: [id number] Date of Birth: 07/01/1990 Referring Provider (PT): Alvino Chapel, MD   Encounter Date: 03/27/2020   PT End of Session - 03/27/20 0935    Visit Number 2    Date for PT Re-Evaluation 06/09/20    Authorization Type medicaid prep    PT Start Time 0935    PT Stop Time 1015    PT Time Calculation (min) 40 min    Activity Tolerance Patient tolerated treatment well    Behavior During Therapy [facility] for tasks assessed/performed           Past Medical History:  Diagnosis Date  . ADHD (attention deficit hyperactivity disorder)   . Family history of adverse reaction to anesthesia    pt's father has hx. of anesthesia awareness during surgery  . Injury of plantar plate of right foot 11/2014  . Irritable bowel syndrome (IBS)    no current med.    Past Surgical History:  Procedure Laterality Date  . COLONOSCOPY WITH PROPOFOL  12/09/2011  . ESOPHAGOGASTRODUODENOSCOPY (EGD) WITH PROPOFOL  12/09/2011  . KNEE ARTHROSCOPY Bilateral   . LIGAMENT REPAIR Right 12/18/2014   Procedure: RIGHT HALLUX PLANTAR PLATE RECONSTRUCTION;  Surgeon: Toni Arthurs, MD;  Location: [facility];  Service: Orthopedics;  Laterality: Right;    There were no vitals filed for this visit.   Subjective Assessment - 03/27/20 0936    Subjective I went to the chiro yesterday and it didn't help as much.  Pt is having more pain now because it is the morning    Currently in Pain? Yes    Pain Score 6     Pain Location Vagina    Pain Orientation Right;Left    Pain Descriptors / Indicators Aching    Pain Type Acute pain    Pain Radiating Towards Lt groin mostly and labia    Pain Onset More than a month ago    Pain Frequency Intermittent    Aggravating Factors  mrning is  the worst    Multiple Pain Sites No                             [facility] Adult PT Treatment/Exercise - 03/27/20 0001      Exercises   Exercises Other Exercises    Other Exercises  core in table lean, circles on ball, ball squeeze, piriformis stretch      Manual Therapy   Manual Therapy Soft tissue mobilization;Myofascial release;Internal Pelvic Floor    Soft tissue mobilization lumbar, glutes    Internal Pelvic Floor levators Lt>Rt urethra mobs                  PT Education - 03/27/20 1016    Education Details Access Code: [email or web address]    Person(s) Educated Patient    Methods Explanation;Demonstration;Tactile cues;Verbal cues;Handout    Comprehension Verbalized understanding;Returned demonstration            PT Short Term Goals - 03/27/20 1018      PT SHORT TERM GOAL #1   Title ind with toileting and self massage    Status Achieved             PT Long Term Goals - 03/17/20 1032      PT LONG  TERM GOAL #1   Title Pt will report at least 50% less pain overall    Time 12    Period Weeks    Status New    Target Date 06/09/20      PT LONG TERM GOAL #2   Title Pt will report easier time having a BM with at least 50% less strain    Baseline straining more 2/3 times    Time 12    Period Weeks    Status New    Target Date 06/09/20      PT LONG TERM GOAL #3   Title Pt will only have to wake up 2-3x at the most    Baseline 5-6x due to pain    Time 12    Target Date 06/09/20      PT LONG TERM GOAL #4   Title Pt will report pain of 4/10 at most with lifting her daughter and walking    Baseline 8/10    Time 12    Period Weeks    Status New    Target Date 06/09/20      PT LONG TERM GOAL #5   Title ind with HEP for continued healthy pregnancy with ability to exercise without increased pain    Baseline does not know    Time 12    Period Weeks    Status New    Target Date 06/09/20                 Plan - 03/27/20 1017    Clinical  Impression Statement Pt did well with STM and stretches. Pt able to begin initial core strengthening today.  Pt did not change since eval but today is initial treatment.  Pt will benefit from skilled PT to continue per plan of care and functional goals    Examination-Activity Limitations Caring for Others;Carry;Continence;Toileting;Stand;Sleep    PT Treatment/Interventions ADLs/Self Care Home Management;Biofeedback;Cryotherapy;Electrical Stimulation;Moist Heat;Therapeutic activities;Therapeutic exercise;Neuromuscular re-education;Manual techniques;Patient/family education;Taping    PT Next Visit Plan internal STM if helped last time and still needed; progress HEP    PT Home Exercise Plan Access Code: [email or web address]    Consulted and Agree with Plan of Care Patient           Patient will benefit from skilled therapeutic intervention in order to improve the following deficits and impairments:  Pain,Postural dysfunction,Impaired flexibility,Increased fascial restricitons,Decreased strength,Decreased coordination  Visit Diagnosis: Unspecified lack of coordination  Muscle weakness (generalized)     Problem List Patient Active Problem List   Diagnosis Date Noted  . Rubella non-immune status, antepartum 04/28/2016  . Supervision of normal first pregnancy, antepartum 04/26/2016  . Night sweats 02/09/2015  . Tinea versicolor 07/10/2012  . BREAST MASS, LEFT 02/16/2009  . Moderate dysplasia of cervix 07/09/2007    Junious Silk, PT 03/27/2020, 10:20 AM  [facility] [address] [place], [place], [address] Phone: [phone]   Fax:  [phone]  Name: Samantha Medina MRN: [id number] Date of Birth: [birth year]

## 2020-03-27 NOTE — Patient Instructions (Signed)
Access Code: [email or web address] URL: [email or web address] Date: 03/27/2020 Prepared by: Dwana Curd  Exercises Seated Swiss Ball Pelvic Circles - 1 x daily - 7 x weekly - 3 sets - 10 reps Standing Low Back Flexion at Table - 1 x daily - 7 x weekly - 1 sets - 5 reps - 10 hold Seated Hip Adduction Squeeze with Ball - 1 x daily - 7 x weekly - 3 sets - 10 reps Seated Piriformis Stretch - 1 x daily - 7 x weekly - 1 sets - 3 reps - 30 sec hold

## 2020-03-31 ENCOUNTER — Ambulatory Visit: Payer: Medicaid Other | Admitting: Physical Therapy

## 2020-03-31 ENCOUNTER — Other Ambulatory Visit: Payer: Self-pay

## 2020-03-31 ENCOUNTER — Encounter: Payer: Self-pay | Admitting: Physical Therapy

## 2020-03-31 DIAGNOSIS — M9905 Segmental and somatic dysfunction of pelvic region: Secondary | ICD-10-CM | POA: Diagnosis not present

## 2020-03-31 DIAGNOSIS — M9903 Segmental and somatic dysfunction of lumbar region: Secondary | ICD-10-CM | POA: Diagnosis not present

## 2020-03-31 DIAGNOSIS — M9902 Segmental and somatic dysfunction of thoracic region: Secondary | ICD-10-CM | POA: Diagnosis not present

## 2020-03-31 DIAGNOSIS — M5416 Radiculopathy, lumbar region: Secondary | ICD-10-CM | POA: Diagnosis not present

## 2020-03-31 DIAGNOSIS — M6281 Muscle weakness (generalized): Secondary | ICD-10-CM | POA: Diagnosis not present

## 2020-03-31 DIAGNOSIS — R279 Unspecified lack of coordination: Secondary | ICD-10-CM | POA: Diagnosis not present

## 2020-03-31 DIAGNOSIS — M62830 Muscle spasm of back: Secondary | ICD-10-CM | POA: Diagnosis not present

## 2020-03-31 DIAGNOSIS — M955 Acquired deformity of pelvis: Secondary | ICD-10-CM | POA: Diagnosis not present

## 2020-03-31 NOTE — Therapy (Addendum)
[facility] [address] [place], [place], [address] Phone: [phone]   Fax:  [phone]  Physical Therapy Treatment  Patient Details  Name: STESHA NEYENS MRN: [id number] Date of Birth: 1990-08-11 Referring Provider (PT): Demetrios Isaacs, MD   Encounter Date: 03/31/2020   PT End of Session - 03/31/20 1021     Visit Number 3    Date for PT Re-Evaluation 06/09/20    Authorization Type medicaid prep    PT Start Time 1021    PT Stop Time 1100    PT Time Calculation (min) 39 min    Activity Tolerance Patient tolerated treatment well    Behavior During Therapy [facility] for tasks assessed/performed             Past Medical History:  Diagnosis Date   ADHD (attention deficit hyperactivity disorder)    Family history of adverse reaction to anesthesia    pt's father has hx. of anesthesia awareness during surgery   Injury of plantar plate of right foot 78/9381   Irritable bowel syndrome (IBS)    no current med.    Past Surgical History:  Procedure Laterality Date   COLONOSCOPY WITH PROPOFOL  12/09/2011   ESOPHAGOGASTRODUODENOSCOPY (EGD) WITH PROPOFOL  12/09/2011   KNEE ARTHROSCOPY Bilateral    LIGAMENT REPAIR Right 12/18/2014   Procedure: RIGHT HALLUX PLANTAR PLATE RECONSTRUCTION;  Surgeon: Wylene Simmer, MD;  Location: [place];  Service: Orthopedics;  Laterality: Right;    There were no vitals filed for this visit.   Subjective Assessment - 03/31/20 1222     Subjective I am so sore today because I cleaned a lot, too much.    Currently in Pain? Yes    Pain Location Groin    Pain Orientation Lower;Right;Left    Pain Descriptors / Indicators Aching    Pain Type Acute pain    Pain Onset More than a month ago    Pain Frequency Intermittent    Multiple Pain Sites No                               [facility] Adult PT Treatment/Exercise - 03/31/20 0001       Self-Care    Self-Care Other Self-Care Comments    Other Self-Care Comments  tennis ball massage, educated and tried belly bnad with pelvic support      Manual Therapy   Soft tissue mobilization lumbar, glutes                      PT Short Term Goals - 03/27/20 1018       PT SHORT TERM GOAL #1   Title ind with toileting and self massage    Status Achieved               PT Long Term Goals - 03/17/20 1032       PT LONG TERM GOAL #1   Title Pt will report at least 50% less pain overall    Time 12    Period Weeks    Status New    Target Date 06/09/20      PT LONG TERM GOAL #2   Title Pt will report easier time having a BM with at least 50% less strain    Baseline straining more 2/3 times    Time 12    Period Weeks    Status New  Target Date 06/09/20      PT LONG TERM GOAL #3   Title Pt will only have to wake up 2-3x at the most    Baseline 5-6x due to pain    Time 12    Target Date 06/09/20      PT LONG TERM GOAL #4   Title Pt will report pain of 4/10 at most with lifting her daughter and walking    Baseline 8/10    Time 12    Period Weeks    Status New    Target Date 06/09/20      PT LONG TERM GOAL #5   Title ind with HEP for continued healthy pregnancy with ability to exercise without increased pain    Baseline does not know    Time 12    Period Weeks    Status New    Target Date 06/09/20                   Plan - 03/31/20 1146     Clinical Impression Statement Pt responded with to pelvic compression.  Pt had a lot of tension throughout lumbar and thoracic paraspinals that released with STM.  PT had release of gluteals with STM and she was educated on self massage with tennis ball.    PT Treatment/Interventions ADLs/Self Care Home Management;Biofeedback;Cryotherapy;Electrical Stimulation;Moist Heat;Therapeutic activities;Therapeutic exercise;Neuromuscular re-education;Manual techniques;Patient/family education;Taping    PT Next Visit Plan f/u  on belly band and tennis ball to glutes; core strength progression and STM addaday    PT Home Exercise Plan Access Code: [email or web address]    Consulted and Agree with Plan of Care Patient             Patient will benefit from skilled therapeutic intervention in order to improve the following deficits and impairments:  Pain,Postural dysfunction,Impaired flexibility,Increased fascial restricitons,Decreased strength,Decreased coordination  Visit Diagnosis: Unspecified lack of coordination  Muscle weakness (generalized)     Problem List Patient Active Problem List   Diagnosis Date Noted   Rubella non-immune status, antepartum 04/28/2016   Supervision of normal first pregnancy, antepartum 04/26/2016   Night sweats 02/09/2015   Tinea versicolor 07/10/2012   BREAST MASS, LEFT 02/16/2009   Moderate dysplasia of cervix 07/09/2007    Camillo Flaming Arron Tetrault, PT 03/31/2020, 12:25 PM  [facility] [address] [place], [place], [address] Phone: [phone]   Fax:  [phone]  Name: ILIANNA BOWN MRN: [id number] Date of Birth: Dec 12, 1990  PHYSICAL THERAPY DISCHARGE SUMMARY  Visits from Start of Care: 3  Current functional level related to goals / functional outcomes: See above goals   Remaining deficits: See above details   Education / Equipment: HEP   Patient agrees to discharge. Patient goals were not met. Patient is being discharged due to not returning since the last visit.  Gustavus Bryant, PT 07/16/20 9:25 AM

## 2020-04-06 ENCOUNTER — Encounter: Payer: Medicaid Other | Admitting: Physical Therapy

## 2020-04-08 DIAGNOSIS — Z3482 Encounter for supervision of other normal pregnancy, second trimester: Secondary | ICD-10-CM | POA: Diagnosis not present

## 2020-04-09 DIAGNOSIS — M955 Acquired deformity of pelvis: Secondary | ICD-10-CM | POA: Diagnosis not present

## 2020-04-09 DIAGNOSIS — M5416 Radiculopathy, lumbar region: Secondary | ICD-10-CM | POA: Diagnosis not present

## 2020-04-09 DIAGNOSIS — M62830 Muscle spasm of back: Secondary | ICD-10-CM | POA: Diagnosis not present

## 2020-04-09 DIAGNOSIS — M9903 Segmental and somatic dysfunction of lumbar region: Secondary | ICD-10-CM | POA: Diagnosis not present

## 2020-04-09 DIAGNOSIS — M9905 Segmental and somatic dysfunction of pelvic region: Secondary | ICD-10-CM | POA: Diagnosis not present

## 2020-04-09 DIAGNOSIS — M9902 Segmental and somatic dysfunction of thoracic region: Secondary | ICD-10-CM | POA: Diagnosis not present

## 2020-04-13 ENCOUNTER — Encounter: Payer: Medicaid Other | Admitting: Physical Therapy

## 2020-04-16 DIAGNOSIS — M9903 Segmental and somatic dysfunction of lumbar region: Secondary | ICD-10-CM | POA: Diagnosis not present

## 2020-04-16 DIAGNOSIS — M955 Acquired deformity of pelvis: Secondary | ICD-10-CM | POA: Diagnosis not present

## 2020-04-16 DIAGNOSIS — M9905 Segmental and somatic dysfunction of pelvic region: Secondary | ICD-10-CM | POA: Diagnosis not present

## 2020-04-16 DIAGNOSIS — M9902 Segmental and somatic dysfunction of thoracic region: Secondary | ICD-10-CM | POA: Diagnosis not present

## 2020-04-16 DIAGNOSIS — M62830 Muscle spasm of back: Secondary | ICD-10-CM | POA: Diagnosis not present

## 2020-04-16 DIAGNOSIS — M5416 Radiculopathy, lumbar region: Secondary | ICD-10-CM | POA: Diagnosis not present

## 2020-04-17 DIAGNOSIS — Z419 Encounter for procedure for purposes other than remedying health state, unspecified: Secondary | ICD-10-CM | POA: Diagnosis not present

## 2020-04-20 ENCOUNTER — Ambulatory Visit: Payer: Medicaid Other | Attending: Obstetrics and Gynecology | Admitting: Physical Therapy

## 2020-04-20 ENCOUNTER — Telehealth: Payer: Self-pay | Admitting: Physical Therapy

## 2020-04-20 NOTE — Telephone Encounter (Signed)
Patient did not show for appointment.  Patient was called and PT left message to please call us back.  Russella Dar, PT 04/20/20 2:32 PM

## 2020-04-23 ENCOUNTER — Encounter: Payer: Medicaid Other | Admitting: Physical Therapy

## 2020-04-23 DIAGNOSIS — M5416 Radiculopathy, lumbar region: Secondary | ICD-10-CM | POA: Diagnosis not present

## 2020-04-23 DIAGNOSIS — M9902 Segmental and somatic dysfunction of thoracic region: Secondary | ICD-10-CM | POA: Diagnosis not present

## 2020-04-23 DIAGNOSIS — M9905 Segmental and somatic dysfunction of pelvic region: Secondary | ICD-10-CM | POA: Diagnosis not present

## 2020-04-23 DIAGNOSIS — M9903 Segmental and somatic dysfunction of lumbar region: Secondary | ICD-10-CM | POA: Diagnosis not present

## 2020-04-23 DIAGNOSIS — M955 Acquired deformity of pelvis: Secondary | ICD-10-CM | POA: Diagnosis not present

## 2020-04-23 DIAGNOSIS — M62830 Muscle spasm of back: Secondary | ICD-10-CM | POA: Diagnosis not present

## 2020-04-28 ENCOUNTER — Encounter: Payer: Medicaid Other | Admitting: Physical Therapy

## 2020-04-30 ENCOUNTER — Encounter: Payer: Medicaid Other | Admitting: Physical Therapy

## 2020-04-30 DIAGNOSIS — M9902 Segmental and somatic dysfunction of thoracic region: Secondary | ICD-10-CM | POA: Diagnosis not present

## 2020-04-30 DIAGNOSIS — M62830 Muscle spasm of back: Secondary | ICD-10-CM | POA: Diagnosis not present

## 2020-04-30 DIAGNOSIS — M9903 Segmental and somatic dysfunction of lumbar region: Secondary | ICD-10-CM | POA: Diagnosis not present

## 2020-04-30 DIAGNOSIS — M955 Acquired deformity of pelvis: Secondary | ICD-10-CM | POA: Diagnosis not present

## 2020-04-30 DIAGNOSIS — M5416 Radiculopathy, lumbar region: Secondary | ICD-10-CM | POA: Diagnosis not present

## 2020-04-30 DIAGNOSIS — M9905 Segmental and somatic dysfunction of pelvic region: Secondary | ICD-10-CM | POA: Diagnosis not present

## 2020-05-07 DIAGNOSIS — M9905 Segmental and somatic dysfunction of pelvic region: Secondary | ICD-10-CM | POA: Diagnosis not present

## 2020-05-07 DIAGNOSIS — M9903 Segmental and somatic dysfunction of lumbar region: Secondary | ICD-10-CM | POA: Diagnosis not present

## 2020-05-07 DIAGNOSIS — M955 Acquired deformity of pelvis: Secondary | ICD-10-CM | POA: Diagnosis not present

## 2020-05-07 DIAGNOSIS — M5416 Radiculopathy, lumbar region: Secondary | ICD-10-CM | POA: Diagnosis not present

## 2020-05-07 DIAGNOSIS — M62830 Muscle spasm of back: Secondary | ICD-10-CM | POA: Diagnosis not present

## 2020-05-07 DIAGNOSIS — M9902 Segmental and somatic dysfunction of thoracic region: Secondary | ICD-10-CM | POA: Diagnosis not present

## 2020-05-12 DIAGNOSIS — Z3483 Encounter for supervision of other normal pregnancy, third trimester: Secondary | ICD-10-CM | POA: Diagnosis not present

## 2020-05-17 DIAGNOSIS — Z419 Encounter for procedure for purposes other than remedying health state, unspecified: Secondary | ICD-10-CM | POA: Diagnosis not present

## 2020-06-09 DIAGNOSIS — Z0371 Encounter for suspected problem with amniotic cavity and membrane ruled out: Secondary | ICD-10-CM | POA: Diagnosis not present

## 2020-06-10 DIAGNOSIS — Z3A40 40 weeks gestation of pregnancy: Secondary | ICD-10-CM | POA: Diagnosis not present

## 2020-06-11 DIAGNOSIS — O9279 Other disorders of lactation: Secondary | ICD-10-CM | POA: Diagnosis not present

## 2020-06-17 DIAGNOSIS — Z419 Encounter for procedure for purposes other than remedying health state, unspecified: Secondary | ICD-10-CM | POA: Diagnosis not present

## 2020-07-16 DIAGNOSIS — O9279 Other disorders of lactation: Secondary | ICD-10-CM | POA: Diagnosis not present

## 2020-07-17 DIAGNOSIS — Z419 Encounter for procedure for purposes other than remedying health state, unspecified: Secondary | ICD-10-CM | POA: Diagnosis not present

## 2020-08-17 DIAGNOSIS — Z419 Encounter for procedure for purposes other than remedying health state, unspecified: Secondary | ICD-10-CM | POA: Diagnosis not present

## 2020-09-17 DIAGNOSIS — Z419 Encounter for procedure for purposes other than remedying health state, unspecified: Secondary | ICD-10-CM | POA: Diagnosis not present

## 2020-10-17 DIAGNOSIS — Z419 Encounter for procedure for purposes other than remedying health state, unspecified: Secondary | ICD-10-CM | POA: Diagnosis not present

## 2020-11-17 DIAGNOSIS — Z419 Encounter for procedure for purposes other than remedying health state, unspecified: Secondary | ICD-10-CM | POA: Diagnosis not present

## 2020-12-17 DIAGNOSIS — Z419 Encounter for procedure for purposes other than remedying health state, unspecified: Secondary | ICD-10-CM | POA: Diagnosis not present

## 2021-01-17 DIAGNOSIS — Z419 Encounter for procedure for purposes other than remedying health state, unspecified: Secondary | ICD-10-CM | POA: Diagnosis not present

## 2021-02-17 DIAGNOSIS — Z419 Encounter for procedure for purposes other than remedying health state, unspecified: Secondary | ICD-10-CM | POA: Diagnosis not present

## 2021-03-17 DIAGNOSIS — Z419 Encounter for procedure for purposes other than remedying health state, unspecified: Secondary | ICD-10-CM | POA: Diagnosis not present

## 2021-04-17 DIAGNOSIS — Z419 Encounter for procedure for purposes other than remedying health state, unspecified: Secondary | ICD-10-CM | POA: Diagnosis not present

## 2021-05-17 DIAGNOSIS — Z419 Encounter for procedure for purposes other than remedying health state, unspecified: Secondary | ICD-10-CM | POA: Diagnosis not present

## 2021-06-17 DIAGNOSIS — Z419 Encounter for procedure for purposes other than remedying health state, unspecified: Secondary | ICD-10-CM | POA: Diagnosis not present

## 2021-07-17 DIAGNOSIS — Z419 Encounter for procedure for purposes other than remedying health state, unspecified: Secondary | ICD-10-CM | POA: Diagnosis not present

## 2021-08-17 DIAGNOSIS — Z419 Encounter for procedure for purposes other than remedying health state, unspecified: Secondary | ICD-10-CM | POA: Diagnosis not present

## 2021-09-17 DIAGNOSIS — Z419 Encounter for procedure for purposes other than remedying health state, unspecified: Secondary | ICD-10-CM | POA: Diagnosis not present

## 2021-10-17 DIAGNOSIS — Z419 Encounter for procedure for purposes other than remedying health state, unspecified: Secondary | ICD-10-CM | POA: Diagnosis not present

## 2021-11-17 DIAGNOSIS — Z419 Encounter for procedure for purposes other than remedying health state, unspecified: Secondary | ICD-10-CM | POA: Diagnosis not present

## 2021-12-17 DIAGNOSIS — Z419 Encounter for procedure for purposes other than remedying health state, unspecified: Secondary | ICD-10-CM | POA: Diagnosis not present

## 2022-01-17 DIAGNOSIS — Z419 Encounter for procedure for purposes other than remedying health state, unspecified: Secondary | ICD-10-CM | POA: Diagnosis not present

## 2022-02-17 DIAGNOSIS — Z419 Encounter for procedure for purposes other than remedying health state, unspecified: Secondary | ICD-10-CM | POA: Diagnosis not present

## 2022-03-18 DIAGNOSIS — Z419 Encounter for procedure for purposes other than remedying health state, unspecified: Secondary | ICD-10-CM | POA: Diagnosis not present

## 2022-04-18 DIAGNOSIS — Z419 Encounter for procedure for purposes other than remedying health state, unspecified: Secondary | ICD-10-CM | POA: Diagnosis not present

## 2022-05-18 DIAGNOSIS — Z419 Encounter for procedure for purposes other than remedying health state, unspecified: Secondary | ICD-10-CM | POA: Diagnosis not present

## 2022-06-18 DIAGNOSIS — Z419 Encounter for procedure for purposes other than remedying health state, unspecified: Secondary | ICD-10-CM | POA: Diagnosis not present

## 2022-07-18 DIAGNOSIS — Z419 Encounter for procedure for purposes other than remedying health state, unspecified: Secondary | ICD-10-CM | POA: Diagnosis not present

## 2022-08-18 DIAGNOSIS — Z419 Encounter for procedure for purposes other than remedying health state, unspecified: Secondary | ICD-10-CM | POA: Diagnosis not present

## 2022-09-18 DIAGNOSIS — Z419 Encounter for procedure for purposes other than remedying health state, unspecified: Secondary | ICD-10-CM | POA: Diagnosis not present

## 2022-10-18 DIAGNOSIS — Z419 Encounter for procedure for purposes other than remedying health state, unspecified: Secondary | ICD-10-CM | POA: Diagnosis not present

## 2022-11-18 DIAGNOSIS — Z419 Encounter for procedure for purposes other than remedying health state, unspecified: Secondary | ICD-10-CM | POA: Diagnosis not present

## 2022-12-18 DIAGNOSIS — Z419 Encounter for procedure for purposes other than remedying health state, unspecified: Secondary | ICD-10-CM | POA: Diagnosis not present

## 2023-01-18 DIAGNOSIS — Z419 Encounter for procedure for purposes other than remedying health state, unspecified: Secondary | ICD-10-CM | POA: Diagnosis not present

## 2023-02-18 DIAGNOSIS — Z419 Encounter for procedure for purposes other than remedying health state, unspecified: Secondary | ICD-10-CM | POA: Diagnosis not present

## 2023-03-18 DIAGNOSIS — Z419 Encounter for procedure for purposes other than remedying health state, unspecified: Secondary | ICD-10-CM | POA: Diagnosis not present

## 2023-04-10 DIAGNOSIS — B3781 Candidal esophagitis: Secondary | ICD-10-CM | POA: Diagnosis not present

## 2023-04-10 DIAGNOSIS — R070 Pain in throat: Secondary | ICD-10-CM | POA: Diagnosis not present

## 2023-04-10 DIAGNOSIS — Z6829 Body mass index (BMI) 29.0-29.9, adult: Secondary | ICD-10-CM | POA: Diagnosis not present

## 2023-04-29 DIAGNOSIS — Z419 Encounter for procedure for purposes other than remedying health state, unspecified: Secondary | ICD-10-CM | POA: Diagnosis not present

## 2023-05-29 DIAGNOSIS — Z419 Encounter for procedure for purposes other than remedying health state, unspecified: Secondary | ICD-10-CM | POA: Diagnosis not present

## 2023-06-28 DIAGNOSIS — B379 Candidiasis, unspecified: Secondary | ICD-10-CM | POA: Diagnosis not present

## 2023-06-29 DIAGNOSIS — Z419 Encounter for procedure for purposes other than remedying health state, unspecified: Secondary | ICD-10-CM | POA: Diagnosis not present

## 2023-07-29 DIAGNOSIS — Z419 Encounter for procedure for purposes other than remedying health state, unspecified: Secondary | ICD-10-CM | POA: Diagnosis not present

## 2023-08-29 DIAGNOSIS — Z419 Encounter for procedure for purposes other than remedying health state, unspecified: Secondary | ICD-10-CM | POA: Diagnosis not present

## 2023-09-29 DIAGNOSIS — Z419 Encounter for procedure for purposes other than remedying health state, unspecified: Secondary | ICD-10-CM | POA: Diagnosis not present

## 2023-11-29 DIAGNOSIS — Z419 Encounter for procedure for purposes other than remedying health state, unspecified: Secondary | ICD-10-CM | POA: Diagnosis not present
